# Patient Record
Sex: Female | Born: 1990 | Race: White | Hispanic: Yes | Marital: Single | Smoking: Current every day smoker
Health system: Southern US, Community
[De-identification: ages and names within clinical notes are randomized; demographics above are authoritative.]

## PROBLEM LIST (undated history)

## (undated) DIAGNOSIS — Z789 Other specified health status: Secondary | ICD-10-CM

## (undated) HISTORY — PX: NO PAST SURGERIES: SHX2092

---
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
## 2012-02-25 ENCOUNTER — Encounter (HOSPITAL_COMMUNITY): Payer: Self-pay | Admitting: Emergency Medicine

## 2012-02-25 ENCOUNTER — Emergency Department (INDEPENDENT_AMBULATORY_CARE_PROVIDER_SITE_OTHER)
Admission: EM | Admit: 2012-02-25 | Discharge: 2012-02-25 | Disposition: A | Discharge status: Home / Self Care | Payer: Medicaid Other | Source: Home / Self Care

## 2012-02-25 ENCOUNTER — Emergency Department (INDEPENDENT_AMBULATORY_CARE_PROVIDER_SITE_OTHER): Payer: Medicaid Other

## 2012-02-25 DIAGNOSIS — M549 Dorsalgia, unspecified: Secondary | ICD-10-CM

## 2012-02-25 MED ORDER — TRAMADOL HCL 50 MG PO TABS: 50.0000 mg | ORAL_TABLET | Freq: Four times a day (QID) | ORAL | Status: AC | PRN

## 2012-02-25 MED ORDER — KETOROLAC TROMETHAMINE 30 MG/ML IJ SOLN
INTRAMUSCULAR | Status: AC
  Filled 2012-02-25: qty 1

## 2012-02-25 MED ORDER — KETOROLAC TROMETHAMINE 60 MG/2ML IM SOLN
30.0000 mg | Freq: Once | INTRAMUSCULAR | Status: AC
  Administered 2012-02-25: 30 mg via INTRAMUSCULAR

## 2012-02-25 MED ORDER — METHYLPREDNISOLONE 4 MG PO KIT: PACK | ORAL | Status: AC
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
## 2012-02-27 NOTE — ED Provider Notes (Signed)
Medical screening examination/treatment/procedure(s) were performed by non-physician practitioner and as supervising physician I was immediately available for consultation/collaboration.  Leslee Home, M.D.   Reuben Likes, MD 02/27/12 [phone]

## 2012-02-29 ENCOUNTER — Other Ambulatory Visit (HOSPITAL_COMMUNITY): Payer: Self-pay | Admitting: Chiropractic Medicine

## 2012-02-29 DIAGNOSIS — K458 Other specified abdominal hernia without obstruction or gangrene: Secondary | ICD-10-CM

## 2012-03-01 ENCOUNTER — Ambulatory Visit (HOSPITAL_COMMUNITY)
Admission: RE | Admit: 2012-03-01 | Discharge: 2012-03-01 | Disposition: A | Discharge status: Home / Self Care | Payer: Medicaid Other | Source: Ambulatory Visit | Attending: Chiropractic Medicine | Admitting: Chiropractic Medicine

## 2012-03-01 DIAGNOSIS — M79609 Pain in unspecified limb: Secondary | ICD-10-CM | POA: Insufficient documentation

## 2012-03-01 DIAGNOSIS — K458 Other specified abdominal hernia without obstruction or gangrene: Secondary | ICD-10-CM

## 2012-03-01 DIAGNOSIS — M545 Low back pain, unspecified: Secondary | ICD-10-CM | POA: Insufficient documentation

## 2012-03-01 DIAGNOSIS — N281 Cyst of kidney, acquired: Secondary | ICD-10-CM | POA: Insufficient documentation

## 2014-10-12 ENCOUNTER — Emergency Department (INDEPENDENT_AMBULATORY_CARE_PROVIDER_SITE_OTHER): Payer: Medicaid Other

## 2014-10-12 ENCOUNTER — Encounter (HOSPITAL_COMMUNITY): Payer: Self-pay | Admitting: Emergency Medicine

## 2014-10-12 ENCOUNTER — Emergency Department (HOSPITAL_COMMUNITY)
Admission: EM | Admit: 2014-10-12 | Discharge: 2014-10-12 | Disposition: A | Discharge status: Home / Self Care | Payer: Medicaid Other | Source: Home / Self Care | Attending: Emergency Medicine | Admitting: Emergency Medicine

## 2014-10-12 DIAGNOSIS — S60042A Contusion of left ring finger without damage to nail, initial encounter: Secondary | ICD-10-CM
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
## 2014-10-12 NOTE — Discharge Instructions (Signed)
Your films are without without fracture or dislocation. Please wear splint as needed for comfort. Ice and elevation to reduce swelling. Ibuprofen as directed on packaging for pain.  If symptoms do not improve over next 1-2 weeks, please follow up with hand orthopedist listed on your discharge paperwork.  Contusion A contusion is a deep bruise. Contusions are the result of an injury that caused bleeding under the skin. The contusion may turn blue, purple, or yellow. Minor injuries will give you a painless contusion, but more severe contusions may stay painful and swollen for a few weeks.  CAUSES  A contusion is usually caused by a blow, trauma, or direct force to an area of the body. SYMPTOMS   Swelling and redness of the injured area.  Bruising of the injured area.  Tenderness and soreness of the injured area.  Pain. DIAGNOSIS  The diagnosis can be made by taking a history and physical exam. An X-ray, CT scan, or MRI may be needed to determine if there were any associated injuries, such as fractures. TREATMENT  Specific treatment will depend on what area of the body was injured. In general, the best treatment for a contusion is resting, icing, elevating, and applying cold compresses to the injured area. Over-the-counter medicines may also be recommended for pain control. Ask your caregiver what the best treatment is for your contusion. HOME CARE INSTRUCTIONS   Put ice on the injured area.  Put ice in a plastic bag.  Place a towel between your skin and the bag.  Leave the ice on for 15-20 minutes, 3-4 times a day, or as directed by your health care provider.  Only take over-the-counter or prescription medicines for pain, discomfort, or fever as directed by your caregiver. Your caregiver may recommend avoiding anti-inflammatory medicines (aspirin, ibuprofen, and naproxen) for 48 hours because these medicines may increase bruising.  Rest the injured area.  If possible, elevate the  injured area to reduce swelling. SEEK IMMEDIATE MEDICAL CARE IF:   You have increased bruising or swelling.  You have pain that is getting worse.  Your swelling or pain is not relieved with medicines. MAKE SURE YOU:   Understand these instructions.  Will watch your condition.  Will get help right away if you are not doing well or get worse. Document Released: 05/12/2005 Document Revised: 08/07/2013 Document Reviewed: 06/07/2011 [facility] Patient Information 2015 [employer], [place]. This information is not intended to replace advice given to you by your health care provider. Make sure you discuss any questions you have with your health care provider.
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
## 2016-05-20 ENCOUNTER — Encounter (HOSPITAL_COMMUNITY): Payer: Self-pay | Admitting: Emergency Medicine

## 2017-05-05 ENCOUNTER — Emergency Department (HOSPITAL_COMMUNITY): Payer: Medicaid Other

## 2017-05-05 ENCOUNTER — Emergency Department (HOSPITAL_COMMUNITY)
Admission: EM | Admit: 2017-05-05 | Discharge: 2017-05-06 | Disposition: A | Discharge status: Home / Self Care | Payer: Medicaid Other | Attending: Emergency Medicine | Admitting: Emergency Medicine

## 2017-05-05 ENCOUNTER — Encounter (HOSPITAL_COMMUNITY): Payer: Self-pay

## 2017-05-05 DIAGNOSIS — R1310 Dysphagia, unspecified: Secondary | ICD-10-CM | POA: Diagnosis present

## 2017-05-05 DIAGNOSIS — Z79899 Other long term (current) drug therapy: Secondary | ICD-10-CM | POA: Insufficient documentation

## 2017-05-05 DIAGNOSIS — R0989 Other specified symptoms and signs involving the circulatory and respiratory systems: Secondary | ICD-10-CM

## 2017-05-05 DIAGNOSIS — F17200 Nicotine dependence, unspecified, uncomplicated: Secondary | ICD-10-CM | POA: Diagnosis not present

## 2017-05-05 NOTE — ED Provider Notes (Signed)
[facility]-EMERGENCY DEPT Provider Note   CSN: [id number] Arrival date & time: 05/05/17  1954     History   Chief Complaint Chief Complaint  Patient presents with  . Swallowed Foreign Body    HPI Mariah Lozano is a 26 y.o. female.  26 year old female presents to the emergency department for foreign body sensation in her throat. Symptoms have been present for 48 hours. She denies onset of symptoms when eating. She has tried drinking soda to relieve her symptoms. She believes they feel worse at nighttime. She denies vomiting, inability to swallow, or drooling. No SOB. She has a hx of bipolar, but feels this is well controlled; denies anxiety.       History reviewed. No pertinent past medical history.  There are no active problems to display for this patient.   History reviewed. No pertinent surgical history.  OB History    No data available       Home Medications    Prior to Admission medications   Medication Sig Start Date End Date Taking? Authorizing Provider  QUEtiapine (SEROQUEL) 200 MG tablet Take 200 mg by mouth at bedtime.    [provider]    Family History History reviewed. No pertinent family history.  Social History Social History  Substance Use Topics  . Smoking status: Current Every Day Smoker  . Smokeless tobacco: Never Used  . Alcohol use No     Allergies   Patient has no known allergies.   Review of Systems Review of Systems Ten systems reviewed and are negative for acute change, except as noted in the HPI.    Physical Exam Updated Vital Signs BP 107/68 (BP Location: Left Arm)   Pulse 82   Temp 97.9 F (36.6 C) (Oral)   Resp 18   Wt 58.7 kg (129 lb 8 oz)   LMP 05/05/2017   SpO2 100%   Physical Exam  Constitutional: She is oriented to person, place, and time. She appears well-developed and well-nourished. No distress.  Nontoxic appearing and in NAD  HENT:  Head: Normocephalic and atraumatic.  Mouth/Throat:  Oropharynx is clear and moist.  Patient tolerating secretions without difficulty.  Eyes: Conjunctivae and EOM are normal. No scleral icterus.  Neck: Normal range of motion.  No stridor  Cardiovascular: Normal rate, regular rhythm and intact distal pulses.   Pulmonary/Chest: Effort normal. No respiratory distress.  Respirations even and unlabored. Lungs CTAB.  Musculoskeletal: Normal range of motion.  Neurological: She is alert and oriented to person, place, and time. She exhibits normal muscle tone. Coordination normal.  Skin: Skin is warm and dry. No rash noted. She is not diaphoretic. No erythema. No pallor.  Psychiatric: She has a normal mood and affect. Her behavior is normal.  Nursing note and vitals reviewed.    ED Treatments / Results  Labs (all labs ordered are listed, but only abnormal results are displayed) Labs Reviewed - No data to display  EKG  EKG Interpretation None       Radiology Ct Soft Tissue Neck Wo Contrast  Result Date: 05/06/2017 CLINICAL DATA:  Dysphagia with foreign body sensation EXAM: CT NECK WITHOUT CONTRAST TECHNIQUE: Multidetector CT imaging of the neck was performed following the standard protocol without intravenous contrast. COMPARISON:  None. FINDINGS: Pharynx and larynx: --Nasopharynx: Fossae of Rosenmuller are clear. Normal adenoid tonsils for age. --Oral cavity and oropharynx: The palatine and lingual tonsils are normal. The visible oral cavity and floor of mouth are normal. Tongue jewelry. --Hypopharynx: Normal vallecula  and pyriform sinuses. --Larynx: Normal epiglottis and pre-epiglottic space. Normal aryepiglottic and vocal folds. --Retropharyngeal space: No abscess, effusion or lymphadenopathy. Salivary glands: --Parotid: No mass lesion or inflammation. No sialolithiasis or ductal dilatation. --Submandibular: Symmetric without inflammation. No sialolithiasis or ductal dilatation. --Sublingual: Normal. No ranula or other visible lesion of the  base of tongue and floor of mouth. Thyroid: Normal. Lymph nodes: No enlarged or abnormal density lymph nodes. Vascular: Normal course and caliber. Limited intracranial: Normal. Visualized orbits: Normal. Mastoids and visualized paranasal sinuses: No fluid levels or advanced mucosal thickening. No mastoid effusion. Skeleton: No bony spinal canal stenosis. No lytic or blastic lesions. Upper chest: Clear. Other: No foreign body identified. IMPRESSION: No aerodigestive tract foreign body or other abnormality of the neck. Electronically Signed   By: Deatra Robinson M.D.   On: 05/06/2017 01:56    Procedures Procedures (including critical care time)  Medications Ordered in ED Medications - No data to display   Initial Impression / Assessment and Plan / ED Course  I have reviewed the triage vital signs and the nursing notes.  Pertinent labs & imaging results that were available during my care of the patient were reviewed by me and considered in my medical decision making (see chart for details).     26 year old female presents to the emergency department for sensation of foreign body in her throat. Symptoms persistent 48 hours. Patient tolerating secretions. No shortness of breath or hypoxia. No stridor. Imaging negative for foreign body or other abnormality. 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 Plan to refer to ENT should symptoms persist. Return precautions discussed and provided. Patient discharged in stable condition with no unaddressed concerns.   Final Clinical Impressions(s) / ED Diagnoses   Final diagnoses:  Foreign body sensation in throat    New Prescriptions New Prescriptions   No medications on file     Antony Madura, Cordelia Poche 05/06/17 0225    Mesner, Barbara Cower, MD 05/09/17 [phone]

## 2022-01-16 ENCOUNTER — Emergency Department (HOSPITAL_COMMUNITY): Payer: Medicaid Other

## 2022-01-16 ENCOUNTER — Encounter (HOSPITAL_COMMUNITY): Payer: Self-pay

## 2022-01-16 ENCOUNTER — Other Ambulatory Visit: Payer: Self-pay

## 2022-01-16 ENCOUNTER — Inpatient Hospital Stay (HOSPITAL_COMMUNITY)
Admission: EM | Admit: 2022-01-16 | Discharge: 2022-01-18 | DRG: 378 | Disposition: A | Discharge status: Home / Self Care | Payer: Medicaid Other | Attending: Internal Medicine | Admitting: Internal Medicine

## 2022-01-16 DIAGNOSIS — E8809 Other disorders of plasma-protein metabolism, not elsewhere classified: Secondary | ICD-10-CM | POA: Diagnosis present

## 2022-01-16 DIAGNOSIS — D72829 Elevated white blood cell count, unspecified: Secondary | ICD-10-CM | POA: Diagnosis present

## 2022-01-16 DIAGNOSIS — F17210 Nicotine dependence, cigarettes, uncomplicated: Secondary | ICD-10-CM | POA: Diagnosis present

## 2022-01-16 DIAGNOSIS — K449 Diaphragmatic hernia without obstruction or gangrene: Secondary | ICD-10-CM | POA: Diagnosis present

## 2022-01-16 DIAGNOSIS — K264 Chronic or unspecified duodenal ulcer with hemorrhage: Principal | ICD-10-CM | POA: Diagnosis present

## 2022-01-16 DIAGNOSIS — R112 Nausea with vomiting, unspecified: Secondary | ICD-10-CM

## 2022-01-16 DIAGNOSIS — K551 Chronic vascular disorders of intestine: Secondary | ICD-10-CM

## 2022-01-16 DIAGNOSIS — Z681 Body mass index (BMI) 19 or less, adult: Secondary | ICD-10-CM | POA: Diagnosis not present

## 2022-01-16 DIAGNOSIS — R1013 Epigastric pain: Principal | ICD-10-CM

## 2022-01-16 DIAGNOSIS — Z79899 Other long term (current) drug therapy: Secondary | ICD-10-CM | POA: Diagnosis not present

## 2022-01-16 DIAGNOSIS — K269 Duodenal ulcer, unspecified as acute or chronic, without hemorrhage or perforation: Secondary | ICD-10-CM | POA: Diagnosis not present

## 2022-01-16 DIAGNOSIS — R636 Underweight: Secondary | ICD-10-CM | POA: Diagnosis present

## 2022-01-16 DIAGNOSIS — R109 Unspecified abdominal pain: Secondary | ICD-10-CM | POA: Diagnosis not present

## 2022-01-16 DIAGNOSIS — F411 Generalized anxiety disorder: Secondary | ICD-10-CM | POA: Diagnosis present

## 2022-01-16 HISTORY — DX: Other specified health status: Z78.9

## 2022-01-16 LAB — URINALYSIS, ROUTINE W REFLEX MICROSCOPIC
Bilirubin Urine: NEGATIVE
Glucose, UA: NEGATIVE mg/dL
Hgb urine dipstick: NEGATIVE
Ketones, ur: NEGATIVE mg/dL
Leukocytes,Ua: NEGATIVE
Nitrite: NEGATIVE
Protein, ur: 30 mg/dL — AB
Specific Gravity, Urine: 1.023 (ref 1.005–1.030)
pH: 9 — ABNORMAL HIGH (ref 5.0–8.0)

## 2022-01-16 LAB — CBC
HCT: 41.7 % (ref 36.0–46.0)
Hemoglobin: 14 g/dL (ref 12.0–15.0)
MCH: 32 pg (ref 26.0–34.0)
MCHC: 33.6 g/dL (ref 30.0–36.0)
MCV: 95.2 fL (ref 80.0–100.0)
Platelets: 364 10*3/uL (ref 150–400)
RBC: 4.38 MIL/uL (ref 3.87–5.11)
RDW: 13.7 % (ref 11.5–15.5)
WBC: 11.2 10*3/uL — ABNORMAL HIGH (ref 4.0–10.5)
nRBC: 0 % (ref 0.0–0.2)

## 2022-01-16 LAB — COMPREHENSIVE METABOLIC PANEL
ALT: 19 U/L (ref 0–44)
AST: 16 U/L (ref 15–41)
Albumin: 4.4 g/dL (ref 3.5–5.0)
Alkaline Phosphatase: 43 U/L (ref 38–126)
Anion gap: 5 (ref 5–15)
BUN: 11 mg/dL (ref 6–20)
CO2: 27 mmol/L (ref 22–32)
Calcium: 8.9 mg/dL (ref 8.9–10.3)
Chloride: 109 mmol/L (ref 98–111)
Creatinine, Ser: 0.66 mg/dL (ref 0.44–1.00)
GFR, Estimated: >60 mL/min (ref >60)
Glucose, Bld: 101 mg/dL — ABNORMAL HIGH (ref 70–99)
Potassium: 3.8 mmol/L (ref 3.5–5.1)
Sodium: 141 mmol/L (ref 135–145)
Total Bilirubin: 0.5 mg/dL (ref 0.3–1.2)
Total Protein: 7 g/dL (ref 6.5–8.1)

## 2022-01-16 LAB — I-STAT BETA HCG BLOOD, ED (MC, WL, AP ONLY): I-stat hCG, quantitative: <5 m[IU]/mL (ref <5)

## 2022-01-16 LAB — LIPASE, BLOOD: Lipase: 24 U/L (ref 11–51)

## 2022-01-16 MED ORDER — SODIUM CHLORIDE 0.9 % IV SOLN: INTRAVENOUS | Status: DC

## 2022-01-16 MED ORDER — ONDANSETRON 4 MG PO TBDP
4.0000 mg | ORAL_TABLET | Freq: Once | ORAL | Status: AC
  Administered 2022-01-16: 4 mg via ORAL
  Filled 2022-01-16: qty 1

## 2022-01-16 MED ORDER — LORAZEPAM 2 MG/ML IJ SOLN
1.0000 mg | Freq: Once | INTRAMUSCULAR | Status: AC
  Administered 2022-01-16: 1 mg via INTRAVENOUS
  Filled 2022-01-16: qty 1

## 2022-01-16 MED ORDER — ACETAMINOPHEN 325 MG PO TABS: 650.0000 mg | ORAL_TABLET | Freq: Four times a day (QID) | ORAL | Status: DC | PRN

## 2022-01-16 MED ORDER — NALOXONE HCL 0.4 MG/ML IJ SOLN: 0.4000 mg | INTRAMUSCULAR | Status: DC | PRN

## 2022-01-16 MED ORDER — ONDANSETRON HCL 4 MG/2ML IJ SOLN
4.0000 mg | Freq: Once | INTRAMUSCULAR | Status: AC
  Administered 2022-01-16: 4 mg via INTRAVENOUS
  Filled 2022-01-16: qty 2

## 2022-01-16 MED ORDER — PANTOPRAZOLE SODIUM 40 MG IV SOLR
40.0000 mg | Freq: Once | INTRAVENOUS | Status: AC
  Administered 2022-01-16: 40 mg via INTRAVENOUS
  Filled 2022-01-16: qty 10

## 2022-01-16 MED ORDER — ALUM & MAG HYDROXIDE-SIMETH 200-200-20 MG/5ML PO SUSP
30.0000 mL | Freq: Once | ORAL | Status: AC
  Administered 2022-01-16: 30 mL via ORAL
  Filled 2022-01-16: qty 30

## 2022-01-16 MED ORDER — IOHEXOL 300 MG/ML  SOLN
100.0000 mL | Freq: Once | INTRAMUSCULAR | Status: AC | PRN
  Administered 2022-01-16: 80 mL via INTRAVENOUS

## 2022-01-16 MED ORDER — HYDROMORPHONE HCL 1 MG/ML IJ SOLN: 0.5000 mg | INTRAMUSCULAR | Status: DC | PRN

## 2022-01-16 MED ORDER — ONDANSETRON HCL 4 MG/2ML IJ SOLN: 4.0000 mg | Freq: Four times a day (QID) | INTRAMUSCULAR | Status: DC | PRN

## 2022-01-16 MED ORDER — POTASSIUM CHLORIDE IN NACL 20-0.9 MEQ/L-% IV SOLN
INTRAVENOUS | Status: DC
  Filled 2022-01-16 (×3): qty 1000

## 2022-01-16 MED ORDER — SODIUM CHLORIDE 0.9 % IV BOLUS
1000.0000 mL | Freq: Once | INTRAVENOUS | Status: AC
  Administered 2022-01-16: 1000 mL via INTRAVENOUS

## 2022-01-16 MED ORDER — LORAZEPAM 2 MG/ML IJ SOLN: 1.0000 mg | Freq: Four times a day (QID) | INTRAMUSCULAR | Status: DC | PRN

## 2022-01-16 MED ORDER — ACETAMINOPHEN 650 MG RE SUPP: 650.0000 mg | Freq: Four times a day (QID) | RECTAL | Status: DC | PRN

## 2022-01-16 MED ORDER — LIDOCAINE VISCOUS HCL 2 % MT SOLN
15.0000 mL | Freq: Once | OROMUCOSAL | Status: AC
  Administered 2022-01-16: 15 mL via ORAL
  Filled 2022-01-16: qty 15

## 2022-01-16 NOTE — Progress Notes (Signed)
  Carryover admission to the Day Admitter.  I discussed this case with the EDP, Dr.Horton.  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  Quadrant ultrasound reportedly showed no evidence of acute pathology.  However, ensuing CT abdomen/pelvis reportedly showed findings suggestive of superior mesenteric artery syndrome.  Dr. Dina Rich has discussed patient's case and imaging with the on-call [facility] gastroenterologist, Dr.Karki, Who will consult and see the patient in the morning.  Additionally, PCP is please consult with the on-call general surgeon.  Orders for NG tube have also been placed.  I have placed an order for inpatient admission for the above.   I have placed some additional preliminary admit orders via the adult multi-morbid admission order set. I have also ordered n.p.o., prn IV Dilaudid, prn IV Zofran.  Confirmed order for continuous IV fluids.    Babs Bertin, DO Hospitalist

## 2022-01-16 NOTE — H&P (Signed)
History and Physical    Patient: Mariah Lozano MBW:[id number] DOB: Mar 12, 1991 DOA: 01/16/2022 DOS: the patient was seen and examined on 01/16/2022 PCP: [facility]  Patient coming from: Home  Chief Complaint:  Chief Complaint  Patient presents with   Abdominal Pain   HPI: Mariah Lozano is a 31 y.o. female with no previous past medical history who is coming to the emergency department with complaints of abdominal pain for the past 5 days that worsens with food intake associated with daily nausea and emesis.  No fever, chills, diarrhea, constipation, melena or hematochezia.  No flank pain, dysuria, frequency or hematuria.  ED course: Initial vital signs were temperature 97.6 F, pulse 80, respiration 18, BP 118/77 mmHg O2 sat 100% on room air.  The patient received Maalox with Ciloxan, ondansetron 4 mg ODT, ondansetron 4 mg IVP, pantoprazole 40 mg IVP and 1000 mL of normal saline bolus.  Lab work: Urinalysis was cloudy with an increased pH of 9.0, protein of 30 mg/dL and rare bacteria microscopic examination.  CBC with a white count 11.2, hemoglobin 14.0 g/dL platelets 017.  Lipase was normal.  I-STAT hCG was negative.  CMP showed a glucose of 101 mg deciliter, but was otherwise normal.  Imaging: RUQ ultrasound was negative.  CT abdomen with contrast showed extremely thin body habitus, with paucity of intramesenteric and retroperitoneal fat this is associated with marked narrowing of the third portion of the duodenum as it transitions beneath the superior mesenteric artery.  No other potential acute findings are seen in the abdomen or pelvis to account for the patient's symptoms.   Review of Systems: As mentioned in the history of present illness. All other systems reviewed and are negative. History reviewed. No pertinent past medical history. History reviewed. No pertinent surgical history. Social History:  reports that she has been smoking. She has  never used smokeless tobacco. She reports current drug use. Drug: Marijuana. She reports that she does not drink alcohol.  No Known Allergies  History reviewed.  Per patient, to her knowledge at this moment, there is no medical family history.  Prior to Admission medications   Medication Sig Start Date End Date Taking? Authorizing Provider  QUEtiapine (SEROQUEL) 200 MG tablet Take 200 mg by mouth at bedtime.   Yes [provider]    Physical Exam: Vitals:   01/16/22 0355 01/16/22 0430 01/16/22 0500 01/16/22 1000  BP: 101/67 106/67 112/71 91/64  Pulse: 68 74 65 81  Resp: 16 16 16 19   Temp:      TempSrc:      SpO2: 100% 100% 100% 98%  Weight:      Height:       Physical Exam Vitals and nursing note reviewed.  Constitutional:      General: She is awake. She is not in acute distress.    Appearance: She is underweight.  HENT:     Head: Normocephalic.     Mouth/Throat:     Mouth: Mucous membranes are dry.  Eyes:     General: No scleral icterus.    Pupils: Pupils are equal, round, and reactive to light.  Neck:     Vascular: No JVD.  Cardiovascular:     Rate and Rhythm: Normal rate and regular rhythm.     Heart sounds: S1 normal and S2 normal.  Pulmonary:     Effort: Pulmonary effort is normal.     Breath sounds: Normal breath sounds. No wheezing, rhonchi or rales.  Abdominal:     General: Abdomen is flat. Bowel sounds are normal.     Palpations: Abdomen is soft.     Tenderness: There is no abdominal tenderness. There is no right CVA tenderness, left CVA tenderness, guarding or rebound.  Musculoskeletal:     Cervical back: Neck supple.     Right lower leg: No edema.     Left lower leg: No edema.  Skin:    General: Skin is warm and dry.     Coloration: Skin is not jaundiced.  Neurological:     General: No focal deficit present.     Mental Status: She is alert and oriented to person, place, and time.  Psychiatric:        Mood and Affect: Mood normal.         Behavior: Behavior normal. Behavior is cooperative.    Data Reviewed:  There are no new results to review at this time.  Assessment and Plan: Principal Problem:   Superior mesenteric artery syndrome (HCC) Observation/MedSurg. Keep NPO. Continue IV fluids. NGT suction. Analgesics as needed. Antiemetics as needed. Pantoprazole 40 mg IVP every 24 hours. Keep electrolytes optimized. Follow-up CBC and CMP in AM. Follow-up imaging in the morning. General surgery input appreciated. Gastroenterology will evaluate.    Advance Care Planning:   Code Status: Full Code   Consults: Gastroenterology (Dr. Marca Ancona) and gastroenterology (Dr. Freida Busman).  Family Communication:   Severity of Illness: The appropriate patient status for this patient is INPATIENT. Inpatient status is judged to be reasonable and necessary in order to provide the required intensity of service to ensure the patient's safety. The patient's presenting symptoms, physical exam findings, and initial radiographic and laboratory data in the context of their chronic comorbidities is felt to place them at high risk for further clinical deterioration. Furthermore, it is not anticipated that the patient will be medically stable for discharge from the hospital within 2 midnights of admission.   * I certify that at the point of admission it is my clinical judgment that the patient will require inpatient hospital care spanning beyond 2 midnights from the point of admission due to high intensity of service, high risk for further deterioration and high frequency of surveillance required.*  Author: Bobette Mo, MD 01/16/2022 10:35 AM  For on call review [email or web address].   This document was prepared using [employer] and may contain some unintended transcription errors.

## 2022-01-16 NOTE — Consult Note (Signed)
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 female was in her usual state of health until 6 days ago when she developed nausea and vomiting associated with upper abdominal pain. Patient states she has been under a lot of stress, recently ended a 12-year-long relationship and has lost over 15 pounds in 4 weeks. She has decreased appetite, early satiety, and for the last several days has several episodes of nausea and vomiting, with 1 episode of small blood tinged emesis last night. She has not noted any blood in stool or black stools. Denies difficulty swallowing or pain on swallowing. Denies acid reflux or heartburn. Denies prior EGD or colonoscopy.  She smokes cigarettes, smokes marijuana and takes recreational drugs, including ecstasy, denies IV drug abuse. Denies alcohol use.   History reviewed. No pertinent past medical history.  History reviewed. No pertinent surgical history.  Prior to Admission medications   Medication Sig Start Date End Date Taking? Authorizing Provider  QUEtiapine (SEROQUEL) 200 MG tablet Take 200 mg by mouth at bedtime.   Yes [provider]    Current Facility-Administered Medications  Medication Dose Route Frequency Provider Last Rate Last Admin   0.9 % NaCl with KCl 20 mEq/ L  infusion   Intravenous Continuous Bobette Mortiz, David Manuel, MD       acetaminophen (TYLENOL) tablet 650 mg  650 mg Oral Q6H PRN Howerter, Justin B, DO       Or   acetaminophen (TYLENOL) suppository 650 mg  650 mg Rectal Q6H PRN Howerter, Justin B, DO       HYDROmorphone (DILAUDID) injection 0.5 mg  0.5 mg Intravenous Q2H PRN Howerter, Justin B, DO       naloxone (NARCAN) injection 0.4 mg  0.4 mg Intravenous  PRN Howerter, Justin B, DO       ondansetron (ZOFRAN) injection 4 mg  4 mg Intravenous Q6H PRN Howerter, Justin B, DO       Current Outpatient Medications  Medication Sig Dispense Refill   QUEtiapine (SEROQUEL) 200 MG tablet Take 200 mg by mouth at bedtime.      Allergies as of 01/16/2022   (No Known Allergies)    History reviewed. No pertinent family history.  Social History   Socioeconomic History   Marital status: Single    Spouse name: Not on file   Number of children: Not on file   Years of education: Not on file   Highest education level: Not on file  Occupational History   Not on file  Tobacco Use   Smoking status: Every Day   Smokeless tobacco: Never  Substance and Sexual Activity   Alcohol use: No   Drug use: Yes    Types: Marijuana   Sexual activity: Not on file  Other Topics Concern   Not on file  Social History Narrative   ** Merged History Encounter **       Social Determinants of Health   Financial Resource Strain: Not on file  Food Insecurity: Not on file  Transportation Needs: Not on file  Physical Activity: Not on file  Stress: Not on file  Social Connections: Not on file  Intimate Partner Violence: Not on file    Review of Systems: Positive for GI: Described in detail in HPI.    Gen: involuntary weight loss, denies  any fever, chills, rigors, night sweats, anorexia, fatigue, weakness, malaise  and sleep disorder CV: Denies chest pain, angina, palpitations, syncope, orthopnea, PND, peripheral edema, and claudication. Resp: Denies dyspnea, cough, sputum, wheezing, coughing up blood. GU : Denies urinary burning, blood in urine, urinary frequency, urinary hesitancy, nocturnal urination, and urinary incontinence. MS: Denies joint pain or swelling.  Denies muscle weakness, cramps, atrophy.  Derm: Denies rash, itching, oral ulcerations, hives, unhealing ulcers.  Psych: Depression and anxiety, on Seroquel Heme: Denies bruising, bleeding, and enlarged  lymph nodes. Neuro:  Denies any headaches, dizziness, paresthesias. Endo:  Denies any problems with DM, thyroid, adrenal function.  Physical Exam: Vital signs in last 24 hours: Temp:  [97.6 F (36.4 C)] 97.6 F (36.4 C) (06/03 0040) Pulse Rate:  [65-81] 81 (06/03 1000) Resp:  [16-19] 19 (06/03 1000) BP: (91-118)/(64-77) 91/64 (06/03 1000) SpO2:  [98 %-100 %] 98 % (06/03 1000) Weight:  [49 kg] 49 kg (06/03 0040)    General:   Alert,  Well-developed, well-nourished, pleasant and cooperative in NAD Head:  Normocephalic and atraumatic. Eyes:  Sclera clear, no icterus.   Conjunctiva pink. Ears:  Normal auditory acuity. Nose:  No deformity, discharge,  or lesions. Mouth:  No deformity or lesions.  Oropharynx pink & moist. Neck:  Supple; no masses or thyromegaly. Lungs:  Clear throughout to auscultation.   No wheezes, crackles, or rhonchi. No acute distress. Heart:  Regular rate and rhythm; no murmurs, clicks, rubs,  or gallops. Extremities:  Without clubbing or edema. Neurologic:  Alert and  oriented x4;  grossly normal neurologically. Skin:  Intact without significant lesions or rashes. Psych: Anxious, tearful Abdomen:  Soft, mild epigastric tenderness and nondistended. No masses, hepatosplenomegaly or hernias noted. Normal bowel sounds, without guarding, and without rebound.         Lab Results: Recent Labs    01/16/22 0042  WBC 11.2*  HGB 14.0  HCT 41.7  PLT 364   BMET Recent Labs    01/16/22 0042  NA 141  K 3.8  CL 109  CO2 27  GLUCOSE 101*  BUN 11  CREATININE 0.66  CALCIUM 8.9   LFT Recent Labs    01/16/22 0042  PROT 7.0  ALBUMIN 4.4  AST 16  ALT 19  ALKPHOS 43  BILITOT 0.5   PT/INR No results for input(s): LABPROT, INR in the last 72 hours.  Studies/Results: CT ABDOMEN PELVIS W CONTRAST  Result Date: 01/16/2022 CLINICAL DATA:  31 year old female with history of nausea and vomiting. EXAM: CT ABDOMEN AND PELVIS WITH CONTRAST TECHNIQUE:  Multidetector CT imaging of the abdomen and pelvis was performed using the standard protocol following bolus administration of intravenous contrast. RADIATION DOSE REDUCTION: This exam was performed according to the departmental dose-optimization program which includes automated exposure control, adjustment of the mA and/or kV according to patient size and/or use of iterative reconstruction technique. CONTRAST:  40mL OMNIPAQUE IOHEXOL 300 MG/ML  SOLN COMPARISON:  No priors. FINDINGS: Lower chest: Unremarkable. Hepatobiliary: No suspicious cystic or solid hepatic lesions. No intra or extrahepatic biliary ductal dilatation. Gallbladder is normal in appearance. Pancreas: No pancreatic mass. No pancreatic ductal dilatation. No pancreatic or peripancreatic fluid collections or inflammatory changes. Spleen: Unremarkable. Adrenals/Urinary Tract: Bilateral kidneys and adrenal glands are normal in appearance. No hydroureteronephrosis. Urinary bladder is unremarkable in appearance. Stomach/Bowel: Stomach is moderately distended. Duodenum also appears distended measuring up to 3.7 cm in diameter in the distal second and proximal third portion of the duodenum for transition beneath the  superior mesenteric artery, beyond which the small bowel appears largely decompressed. No other pathologic dilatation of small bowel or colon. The appendix is not confidently identified and may be surgically absent. Regardless, there are no inflammatory changes noted adjacent to the cecum to suggest the presence of an acute appendicitis at this time. Vascular/Lymphatic: No significant atherosclerotic disease, aneurysm or dissection noted in the abdominal or pelvic vasculature. Narrowed aortomesenteric angle of 9.7 degrees (normal 28-65 degrees) and decreased aortomesenteric distance of only 5 mm (normal 10-34 mm). No lymphadenopathy noted in the abdomen or pelvis. Reproductive: Uterus and ovaries are unremarkable in appearance. Other: Markedly  thin body habitus with paucity of intra mesenteric and retroperitoneal fat. No significant volume of ascites. No pneumoperitoneum. Musculoskeletal: There are no aggressive appearing lytic or blastic lesions noted in the visualized portions of the skeleton. IMPRESSION: 1. Extremely thin body habitus, with paucity of intra mesenteric and retroperitoneal fat. This is associated with marked narrowing of the third portion of the duodenum as it transitions beneath the superior mesenteric artery, with narrowed aortomesenteric angle and distance, as detailed above. Clinical correlation for signs and symptoms of superior mesenteric artery syndrome are recommended. 2. No other potential acute findings are noted elsewhere in the abdomen or pelvis to account for the patient's symptoms. Electronically Signed   By: Trudie Reed M.D.   On: 01/16/2022 05:32   US Abdomen Limited RUQ (LIVER/GB)  Result Date: 01/16/2022 CLINICAL DATA:  Epigastric pain for 5 days EXAM: ULTRASOUND ABDOMEN LIMITED RIGHT UPPER QUADRANT COMPARISON:  None Available. FINDINGS: Gallbladder: No gallstones or wall thickening visualized. No sonographic Murphy sign noted by sonographer. Common bile duct: Diameter: 4.7 mm Liver: No focal lesion identified. Within normal limits in parenchymal echogenicity. Portal vein is patent on color Doppler imaging with normal direction of blood flow towards the liver. Other: None. IMPRESSION: Negative examination Electronically Signed   By: Jasmine Pang M.D.   On: 01/16/2022 03:43    Impression: Nausea, vomiting, 15 pound weight loss in 4 weeks  CT abdomen: Marked narrowing of third portion of duodenum as it transitions beneath the superior mesenteric artery with narrowed aortomesenteric angle and distance, associated with moderately distended stomach, distended duodenum up to 3.7 cm in distal second and proximal third portion Extremely thin body habitus with paucity of intra mesenteric and retroperitoneal  fat  Normal electrolytes, normal renal function, normal T. 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 Recommend NG tube placement, keep to low intermittent wall suction. Supportive management-IV fluids for now. Plan EGD in a.m. Surgery has been consulted.   LOS: 0 days   Kerin Salen, MD  01/16/2022, 11:01 AM

## 2022-01-16 NOTE — ED Notes (Signed)
Attempted to insert NG tube, pt stated she couldn't do it and req RN to stop.

## 2022-01-16 NOTE — H&P (View-Only) (Signed)
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 female was in her usual state of health until 6 days ago when she developed nausea and vomiting associated with upper abdominal pain. Patient states she has been under a lot of stress, recently ended a 12-year-long relationship and has lost over 15 pounds in 4 weeks. She has decreased appetite, early satiety, and for the last several days has several episodes of nausea and vomiting, with 1 episode of small blood tinged emesis last night. She has not noted any blood in stool or black stools. Denies difficulty swallowing or pain on swallowing. Denies acid reflux or heartburn. Denies prior EGD or colonoscopy.  She smokes cigarettes, smokes marijuana and takes recreational drugs, including ecstasy, denies IV drug abuse. Denies alcohol use.   History reviewed. No pertinent past medical history.  History reviewed. No pertinent surgical history.  Prior to Admission medications   Medication Sig Start Date End Date Taking? Authorizing Provider  QUEtiapine (SEROQUEL) 200 MG tablet Take 200 mg by mouth at bedtime.   Yes [provider]    Current Facility-Administered Medications  Medication Dose Route Frequency Provider Last Rate Last Admin   0.9 % NaCl with KCl 20 mEq/ L  infusion   Intravenous Continuous Ortiz, David Manuel, MD       acetaminophen (TYLENOL) tablet 650 mg  650 mg Oral Q6H PRN Howerter, Justin B, DO       Or   acetaminophen (TYLENOL) suppository 650 mg  650 mg Rectal Q6H PRN Howerter, Justin B, DO       HYDROmorphone (DILAUDID) injection 0.5 mg  0.5 mg Intravenous Q2H PRN Howerter, Justin B, DO       naloxone (NARCAN) injection 0.4 mg  0.4 mg Intravenous  PRN Howerter, Justin B, DO       ondansetron (ZOFRAN) injection 4 mg  4 mg Intravenous Q6H PRN Howerter, Justin B, DO       Current Outpatient Medications  Medication Sig Dispense Refill   QUEtiapine (SEROQUEL) 200 MG tablet Take 200 mg by mouth at bedtime.      Allergies as of 01/16/2022   (No Known Allergies)    History reviewed. No pertinent family history.  Social History   Socioeconomic History   Marital status: Single    Spouse name: Not on file   Number of children: Not on file   Years of education: Not on file   Highest education level: Not on file  Occupational History   Not on file  Tobacco Use   Smoking status: Every Day   Smokeless tobacco: Never  Substance and Sexual Activity   Alcohol use: No   Drug use: Yes    Types: Marijuana   Sexual activity: Not on file  Other Topics Concern   Not on file  Social History Narrative   ** Merged History Encounter **       Social Determinants of Health   Financial Resource Strain: Not on file  Food Insecurity: Not on file  Transportation Needs: Not on file  Physical Activity: Not on file  Stress: Not on file  Social Connections: Not on file  Intimate Partner Violence: Not on file    Review of Systems: Positive for GI: Described in detail in HPI.    Gen: involuntary weight loss, denies   any fever, chills, rigors, night sweats, anorexia, fatigue, weakness, malaise  and sleep disorder CV: Denies chest pain, angina, palpitations, syncope, orthopnea, PND, peripheral edema, and claudication. Resp: Denies dyspnea, cough, sputum, wheezing, coughing up blood. GU : Denies urinary burning, blood in urine, urinary frequency, urinary hesitancy, nocturnal urination, and urinary incontinence. MS: Denies joint pain or swelling.  Denies muscle weakness, cramps, atrophy.  Derm: Denies rash, itching, oral ulcerations, hives, unhealing ulcers.  Psych: Depression and anxiety, on Seroquel Heme: Denies bruising, bleeding, and enlarged  lymph nodes. Neuro:  Denies any headaches, dizziness, paresthesias. Endo:  Denies any problems with DM, thyroid, adrenal function.  Physical Exam: Vital signs in last 24 hours: Temp:  [97.6 F (36.4 C)] 97.6 F (36.4 C) (06/03 0040) Pulse Rate:  [65-81] 81 (06/03 1000) Resp:  [16-19] 19 (06/03 1000) BP: (91-118)/(64-77) 91/64 (06/03 1000) SpO2:  [98 %-100 %] 98 % (06/03 1000) Weight:  [49 kg] 49 kg (06/03 0040)    General:   Alert,  Well-developed, well-nourished, pleasant and cooperative in NAD Head:  Normocephalic and atraumatic. Eyes:  Sclera clear, no icterus.   Conjunctiva pink. Ears:  Normal auditory acuity. Nose:  No deformity, discharge,  or lesions. Mouth:  No deformity or lesions.  Oropharynx pink & moist. Neck:  Supple; no masses or thyromegaly. Lungs:  Clear throughout to auscultation.   No wheezes, crackles, or rhonchi. No acute distress. Heart:  Regular rate and rhythm; no murmurs, clicks, rubs,  or gallops. Extremities:  Without clubbing or edema. Neurologic:  Alert and  oriented x4;  grossly normal neurologically. Skin:  Intact without significant lesions or rashes. Psych: Anxious, tearful Abdomen:  Soft, mild epigastric tenderness and nondistended. No masses, hepatosplenomegaly or hernias noted. Normal bowel sounds, without guarding, and without rebound.         Lab Results: Recent Labs    01/16/22 0042  WBC 11.2*  HGB 14.0  HCT 41.7  PLT 364   BMET Recent Labs    01/16/22 0042  NA 141  K 3.8  CL 109  CO2 27  GLUCOSE 101*  BUN 11  CREATININE 0.66  CALCIUM 8.9   LFT Recent Labs    01/16/22 0042  PROT 7.0  ALBUMIN 4.4  AST 16  ALT 19  ALKPHOS 43  BILITOT 0.5   PT/INR No results for input(s): LABPROT, INR in the last 72 hours.  Studies/Results: CT ABDOMEN PELVIS W CONTRAST  Result Date: 01/16/2022 CLINICAL DATA:  31 year old female with history of nausea and vomiting. EXAM: CT ABDOMEN AND PELVIS WITH CONTRAST TECHNIQUE:  Multidetector CT imaging of the abdomen and pelvis was performed using the standard protocol following bolus administration of intravenous contrast. RADIATION DOSE REDUCTION: This exam was performed according to the departmental dose-optimization program which includes automated exposure control, adjustment of the mA and/or kV according to patient size and/or use of iterative reconstruction technique. CONTRAST:  40mL OMNIPAQUE IOHEXOL 300 MG/ML  SOLN COMPARISON:  No priors. FINDINGS: Lower chest: Unremarkable. Hepatobiliary: No suspicious cystic or solid hepatic lesions. No intra or extrahepatic biliary ductal dilatation. Gallbladder is normal in appearance. Pancreas: No pancreatic mass. No pancreatic ductal dilatation. No pancreatic or peripancreatic fluid collections or inflammatory changes. Spleen: Unremarkable. Adrenals/Urinary Tract: Bilateral kidneys and adrenal glands are normal in appearance. No hydroureteronephrosis. Urinary bladder is unremarkable in appearance. Stomach/Bowel: Stomach is moderately distended. Duodenum also appears distended measuring up to 3.7 cm in diameter in the distal second and proximal third portion of the duodenum for transition beneath the  superior mesenteric artery, beyond which the small bowel appears largely decompressed. No other pathologic dilatation of small bowel or colon. The appendix is not confidently identified and may be surgically absent. Regardless, there are no inflammatory changes noted adjacent to the cecum to suggest the presence of an acute appendicitis at this time. Vascular/Lymphatic: No significant atherosclerotic disease, aneurysm or dissection noted in the abdominal or pelvic vasculature. Narrowed aortomesenteric angle of 9.7 degrees (normal 28-65 degrees) and decreased aortomesenteric distance of only 5 mm (normal 10-34 mm). No lymphadenopathy noted in the abdomen or pelvis. Reproductive: Uterus and ovaries are unremarkable in appearance. Other: Markedly  thin body habitus with paucity of intra mesenteric and retroperitoneal fat. No significant volume of ascites. No pneumoperitoneum. Musculoskeletal: There are no aggressive appearing lytic or blastic lesions noted in the visualized portions of the skeleton. IMPRESSION: 1. Extremely thin body habitus, with paucity of intra mesenteric and retroperitoneal fat. This is associated with marked narrowing of the third portion of the duodenum as it transitions beneath the superior mesenteric artery, with narrowed aortomesenteric angle and distance, as detailed above. Clinical correlation for signs and symptoms of superior mesenteric artery syndrome are recommended. 2. No other potential acute findings are noted elsewhere in the abdomen or pelvis to account for the patient's symptoms. Electronically Signed   By: Trudie Reed M.D.   On: 01/16/2022 05:32   US Abdomen Limited RUQ (LIVER/GB)  Result Date: 01/16/2022 CLINICAL DATA:  Epigastric pain for 5 days EXAM: ULTRASOUND ABDOMEN LIMITED RIGHT UPPER QUADRANT COMPARISON:  None Available. FINDINGS: Gallbladder: No gallstones or wall thickening visualized. No sonographic Murphy sign noted by sonographer. Common bile duct: Diameter: 4.7 mm Liver: No focal lesion identified. Within normal limits in parenchymal echogenicity. Portal vein is patent on color Doppler imaging with normal direction of blood flow towards the liver. Other: None. IMPRESSION: Negative examination Electronically Signed   By: Jasmine Pang M.D.   On: 01/16/2022 03:43    Impression: Nausea, vomiting, 15 pound weight loss in 4 weeks  CT abdomen: Marked narrowing of third portion of duodenum as it transitions beneath the superior mesenteric artery with narrowed aortomesenteric angle and distance, associated with moderately distended stomach, distended duodenum up to 3.7 cm in distal second and proximal third portion Extremely thin body habitus with paucity of intra mesenteric and retroperitoneal  fat  Normal electrolytes, normal renal function, normal T. 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 Recommend NG tube placement, keep to low intermittent wall suction. Supportive management-IV fluids for now. Plan EGD in a.m. Surgery has been consulted.   LOS: 0 days   Kerin Salen, MD  01/16/2022, 11:01 AM

## 2022-01-16 NOTE — ED Triage Notes (Signed)
Patient has been having abdominal pain for 5 days. Pain right above her belly button. Hurts after she eats after every meal. Has been vomiting every day. Able to have bowel movements.

## 2022-01-16 NOTE — ED Provider Notes (Signed)
[facility]-EMERGENCY DEPT Provider Note   CSN: [id number] Arrival date & time: 01/16/22  0033     History  Chief Complaint  Patient presents with   Abdominal Pain    Mariah Lozano is a 31 y.o. female.  HPI     This is a 31 year old female who presents with abdominal pain, nausea, vomiting.  Patient describes 5-day history of worsening epigastric abdominal pain.  She states it is worse with eating.  She states she has had increasing stress at home and "I do not take care of myself."  She states that she takes ecstasy daily.  No alcohol use.  She is having normal bowel movements but has had nonbilious, nonbloody emesis.  Home Medications Prior to Admission medications   Medication Sig Start Date End Date Taking? Authorizing Provider  QUEtiapine (SEROQUEL) 200 MG tablet Take 200 mg by mouth at bedtime.   Yes [provider]      Allergies    Patient has no known allergies.    Review of Systems   Review of Systems  Constitutional:  Negative for fever.  Gastrointestinal:  Positive for abdominal pain and vomiting. Negative for diarrhea and nausea.  All other systems reviewed and are negative.  Physical Exam Updated Vital Signs BP 112/71   Pulse 65   Temp 97.6 F (36.4 C) (Oral)   Resp 16   Ht 1.702 m (5\' 7" )   Wt 49 kg   SpO2 100%   BMI 16.92 kg/m  Physical Exam Vitals and nursing note reviewed.  Constitutional:      Appearance: She is well-developed.  HENT:     Head: Normocephalic and atraumatic.  Eyes:     Pupils: Pupils are equal, round, and reactive to light.  Cardiovascular:     Rate and Rhythm: Normal rate and regular rhythm.  Pulmonary:     Effort: Pulmonary effort is normal. No respiratory distress.     Breath sounds: No wheezing.  Abdominal:     General: Bowel sounds are normal.     Palpations: Abdomen is soft.     Tenderness: There is abdominal tenderness in the right upper quadrant and epigastric area. There is  no guarding or rebound.  Musculoskeletal:     Cervical back: Neck supple.  Skin:    General: Skin is warm and dry.  Neurological:     Mental Status: She is alert and oriented to person, place, and time.    ED Results / Procedures / Treatments   Labs (all labs ordered are listed, but only abnormal results are displayed) Labs Reviewed  COMPREHENSIVE METABOLIC PANEL - Abnormal; Notable for the following components:      Result Value   Glucose, Bld 101 (*)    All other components within normal limits  CBC - Abnormal; Notable for the following components:   WBC 11.2 (*)    All other components within normal limits  URINALYSIS, ROUTINE W REFLEX MICROSCOPIC - Abnormal; Notable for the following components:   APPearance CLOUDY (*)    pH 9.0 (*)    Protein, ur 30 (*)    Bacteria, UA RARE (*)    All other components within normal limits  LIPASE, BLOOD  I-STAT BETA HCG BLOOD, ED ([facility], [facility], [facility] ONLY)    EKG None  Radiology CT ABDOMEN PELVIS W CONTRAST  Result Date: 01/16/2022 CLINICAL DATA:  31 year old female with history of nausea and vomiting. EXAM: CT ABDOMEN AND PELVIS WITH CONTRAST TECHNIQUE: Multidetector CT imaging  of the abdomen and pelvis was performed using the standard protocol following bolus administration of intravenous contrast. RADIATION DOSE REDUCTION: This exam was performed according to the departmental dose-optimization program which includes automated exposure control, adjustment of the mA and/or kV according to patient size and/or use of iterative reconstruction technique. CONTRAST:  68mL OMNIPAQUE IOHEXOL 300 MG/ML  SOLN COMPARISON:  No priors. FINDINGS: Lower chest: Unremarkable. Hepatobiliary: No suspicious cystic or solid hepatic lesions. No intra or extrahepatic biliary ductal dilatation. Gallbladder is normal in appearance. Pancreas: No pancreatic mass. No pancreatic ductal dilatation. No pancreatic or peripancreatic fluid collections or inflammatory changes. Spleen:  Unremarkable. Adrenals/Urinary Tract: Bilateral kidneys and adrenal glands are normal in appearance. No hydroureteronephrosis. Urinary bladder is unremarkable in appearance. Stomach/Bowel: Stomach is moderately distended. Duodenum also appears distended measuring up to 3.7 cm in diameter in the distal second and proximal third portion of the duodenum for transition beneath the superior mesenteric artery, beyond which the small bowel appears largely decompressed. No other pathologic dilatation of small bowel or colon. The appendix is not confidently identified and may be surgically absent. Regardless, there are no inflammatory changes noted adjacent to the cecum to suggest the presence of an acute appendicitis at this time. Vascular/Lymphatic: No significant atherosclerotic disease, aneurysm or dissection noted in the abdominal or pelvic vasculature. Narrowed aortomesenteric angle of 9.7 degrees (normal 28-65 degrees) and decreased aortomesenteric distance of only 5 mm (normal 10-34 mm). No lymphadenopathy noted in the abdomen or pelvis. Reproductive: Uterus and ovaries are unremarkable in appearance. Other: Markedly thin body habitus with paucity of intra mesenteric and retroperitoneal fat. No significant volume of ascites. No pneumoperitoneum. Musculoskeletal: There are no aggressive appearing lytic or blastic lesions noted in the visualized portions of the skeleton. IMPRESSION: 1. Extremely thin body habitus, with paucity of intra mesenteric and retroperitoneal fat. This is associated with marked narrowing of the third portion of the duodenum as it transitions beneath the superior mesenteric artery, with narrowed aortomesenteric angle and distance, as detailed above. Clinical correlation for signs and symptoms of superior mesenteric artery syndrome are recommended. 2. No other potential acute findings are noted elsewhere in the abdomen or pelvis to account for the patient's symptoms. Electronically Signed   By:  Trudie Reed M.D.   On: 01/16/2022 05:32   US Abdomen Limited RUQ (LIVER/GB)  Result Date: 01/16/2022 CLINICAL DATA:  Epigastric pain for 5 days EXAM: ULTRASOUND ABDOMEN LIMITED RIGHT UPPER QUADRANT COMPARISON:  None Available. FINDINGS: Gallbladder: No gallstones or wall thickening visualized. No sonographic Murphy sign noted by sonographer. Common bile duct: Diameter: 4.7 mm Liver: No focal lesion identified. Within normal limits in parenchymal echogenicity. Portal vein is patent on color Doppler imaging with normal direction of blood flow towards the liver. Other: None. IMPRESSION: Negative examination Electronically Signed   By: Jasmine Pang M.D.   On: 01/16/2022 03:43    Procedures .Critical Care Performed by: Shon Baton, MD Authorized by: Shon Baton, MD   Critical care provider statement:    Critical care time (minutes):  30   Critical care was necessary to treat or prevent imminent or life-threatening deterioration of the following conditions:  Dehydration   Critical care was time spent personally by me on the following activities:  Development of treatment plan with patient or surrogate, discussions with consultants, evaluation of patient's response to treatment, examination of patient, ordering and review of laboratory studies, ordering and review of radiographic studies, ordering and performing treatments and interventions, pulse oximetry, re-evaluation of  patient's condition and review of old charts    Medications Ordered in ED Medications  0.9 %  sodium chloride infusion (has no administration in time range)  alum & mag hydroxide-simeth (MAALOX/MYLANTA) 200-200-20 MG/5ML suspension 30 mL (30 mLs Oral Given 01/16/22 0355)    And  lidocaine (XYLOCAINE) 2 % viscous mouth solution 15 mL (15 mLs Oral Given 01/16/22 0355)  ondansetron (ZOFRAN-ODT) disintegrating tablet 4 mg (4 mg Oral Given 01/16/22 0356)  sodium chloride 0.9 % bolus 1,000 mL (0 mLs Intravenous Stopped  01/16/22 0558)  ondansetron (ZOFRAN) injection 4 mg (4 mg Intravenous Given 01/16/22 0435)  pantoprazole (PROTONIX) injection 40 mg (40 mg Intravenous Given 01/16/22 0435)  iohexol (OMNIPAQUE) 300 MG/ML solution 100 mL (80 mLs Intravenous Contrast Given 01/16/22 0504)    ED Course/ Medical Decision Making/ A&P                           Medical Decision Making Amount and/or Complexity of Data Reviewed Labs: ordered. Radiology: ordered.  Risk OTC drugs. Prescription drug management. Decision regarding hospitalization.   This patient presents to the ED for concern of nausea, vomiting, abdominal pain, this involves an extensive number of treatment options, and is a complaint that carries with it a high risk of complications and morbidity.  I considered the following differential and admission for this acute, potentially life threatening condition.  The differential diagnosis includes gastritis, cholecystitis, gastroenteritis, peptic ulcer disease, less likely appendicitis or obstructive pathology  MDM:    This is a 31 year old female who presents with nausea, vomiting, abdominal pain.  Nontoxic-appearing and vital signs are reassuring.  She has epigastric tenderness to palpation as well as right upper quadrant tenderness.  Patient was given fluids, pain, nausea medication.  Labs obtained.  No significant leukocytosis.  Normal LFTs and lipase.  Urinalysis is negative for UTI.  Initially right upper quadrant ultrasound was negative for gallbladder pathology.  Initially attempted p.o. challenge.  Patient was unable to tolerate any thing by mouth and had emesis.  For this reason, CT scan was pursued.  CT scan independently reviewed by myself.  Patient had markedly enlarged stomach.  There was air throughout the colon.  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  Secure chatted Dr. Pati Gallo, gastroenterology.  She will evaluate patient.  Agrees with NG tube  placement and fluids.  Additionally, surgical consult placed with Dr. Freida Busman.  They will see patient as well.  No additional recommendations at this time.  (Labs, imaging, consults)  Labs: I Ordered, and personally interpreted labs.  The pertinent results include: CBC, CMP, lipase, urinalysis  Imaging Studies ordered: I ordered imaging studies including right upper quadrant ultrasound, CT I independently visualized and interpreted imaging. I agree with the radiologist interpretation  Additional history obtained from chart review.  External records from outside source obtained and reviewed including outpatient visits  Cardiac Monitoring: The patient was maintained on a cardiac monitor.  I personally viewed and interpreted the cardiac monitored which showed an underlying rhythm of: Normal sinus rhythm  Reevaluation: After the interventions noted above, I reevaluated the patient and found that they have :improved  Social Determinants of Health: Lives independently  Disposition: Admit  Co morbidities that complicate the patient evaluation History reviewed. No pertinent past medical history.   Medicines Meds ordered this encounter  Medications   AND Linked Order Group    alum & mag hydroxide-simeth (MAALOX/MYLANTA) 200-200-20  MG/5ML suspension 30 mL    lidocaine (XYLOCAINE) 2 % viscous mouth solution 15 mL   ondansetron (ZOFRAN-ODT) disintegrating tablet 4 mg   sodium chloride 0.9 % bolus 1,000 mL   ondansetron (ZOFRAN) injection 4 mg   pantoprazole (PROTONIX) injection 40 mg   iohexol (OMNIPAQUE) 300 MG/ML solution 100 mL   0.9 %  sodium chloride infusion    I have reviewed the patients home medicines and have made adjustments as needed  Problem List / ED Course: Problem List Items Addressed This Visit   None Visit Diagnoses     Epigastric pain    -  Primary   Nausea and vomiting, unspecified vomiting type                       Final Clinical Impression(s)  / ED Diagnoses Final diagnoses:  Epigastric pain  Nausea and vomiting, unspecified vomiting type    Rx / DC Orders ED Discharge Orders     None         Shon BatonHorton, Gamal Todisco F, MD 01/16/22 [phone]

## 2022-01-16 NOTE — Progress Notes (Signed)
[facility] admitting physician addendum:  The nursing staff reported that the patient has been very anxious since she arrived to the unit.  She stated this morning that she did not have a any past medical history, but told the staff now that she suffers from panic attacks and they have noticed that she is very restless.  Earlier, she was unable to tolerate the NG insertion.  Lorazepam 1 mg IVP x1 dose now ordered.  We will see if we are able to insert it after lorazepam given.  Sanda Klein, MD

## 2022-01-16 NOTE — Consult Note (Signed)
[place] [birth year]  OR:[id number].    Requesting MD: Dr. Dina Rich Chief Complaint/Reason for Consult: abdominal pain, possible SMA syndrome  HPI:  Mariah Lozano is a 31 year old female who presented to the ED with abdominal pain.  She says the pain began about 5 to 6 days ago and is gotten progressively worse.  She has never had pain like this before.  It is also associated with nausea and vomiting, and the pain gets worse after she eats.  She reports unintentional weight loss over the last few months.  She says she has had a lot of emotional stressors after ending a long-term relationship.  Labs in the ED are overall unremarkable.  RUQ US showed no cholelithiasis or signs of acute cholecystitis.  A CT scan showed distention of the stomach and mild dilation of the proximal duodenum, and was read as suspicious for SMA syndrome.  The patient has been admitted to the hospitalist service and GI has been consulted.  She is otherwise in good health and has not had any prior abdominal surgeries.  ROS: Review of Systems  Constitutional:  Positive for weight loss. Negative for chills and fever.  Gastrointestinal:  Positive for abdominal pain, nausea and vomiting.  Neurological:  Negative for loss of consciousness and weakness.   History reviewed. No pertinent family history.  History reviewed. No pertinent past medical history.  History reviewed. No pertinent surgical history.  Social History:  reports that she has been smoking. She has never used smokeless tobacco. She reports current drug use. Drug: Marijuana. She reports that she does not drink alcohol.  Allergies: No Known Allergies  (Not in a hospital admission)    Physical Exam: Blood pressure 112/71, pulse 65, temperature 97.6 F (36.4 C), temperature source Oral, resp. rate 16, height 5\' 7"  (1.702 m), weight 49 kg, SpO2 100 %. General: resting comfortably, appears stated age, no apparent distress Neurological: alert and  oriented, no focal deficits, cranial nerves grossly in tact HEENT: normocephalic, atraumatic CV: regular rate and rhythm, extremities warm and well-perfused Respiratory: normal work of breathing on room air, symmetric chest wall expansion Abdomen: soft, nondistended, nontender to palpation. No masses or organomegaly. Extremities: warm and well-perfused, no deformities, moving all extremities spontaneously Psychiatric: normal mood and affect Skin: warm and dry, no jaundice, no rashes or lesions   Results for orders placed or performed during the hospital encounter of 01/16/22 (from the past 48 hour(s))  Lipase, blood     Status: None   Collection Time: 01/16/22 12:42 AM  Result Value Ref Range   Lipase 24 11 - 51 U/L    Comment: Performed at [facility], [place] [address], [place], [place] [address]  Comprehensive metabolic panel     Status: Abnormal   Collection Time: 01/16/22 12:42 AM  Result Value Ref Range   Sodium 141 135 - 145 mmol/L   Potassium 3.8 3.5 - 5.1 mmol/L   Chloride 109 98 - 111 mmol/L   CO2 27 22 - 32 mmol/L   Glucose, Bld 101 (H) 70 - 99 mg/dL    Comment: Glucose reference range applies only to samples taken after fasting for at least 8 hours.   BUN 11 6 - 20 mg/dL   Creatinine, Ser 0.66 0.44 - 1.00 mg/dL   Calcium 8.9 8.9 - 10.3 mg/dL   Total Protein 7.0 6.5 - 8.1 g/dL   Albumin 4.4 3.5 - 5.0 g/dL   AST 16 15 - 41 U/L   ALT 19  0 - 44 U/L   Alkaline Phosphatase 43 38 - 126 U/L   Total Bilirubin 0.5 0.3 - 1.2 mg/dL   GFR, Estimated >60 >60 mL/min    Comment: (NOTE) Calculated using the CKD-EPI Creatinine Equation (2021)    Anion gap 5 5 - 15    Comment: Performed at [facility], [place] [address] [place], [place] [address]  CBC     Status: Abnormal   Collection Time: 01/16/22 12:42 AM  Result Value Ref Range   WBC 11.2 (H) 4.0 - 10.5 K/uL   RBC 4.38 3.87 - 5.11 MIL/uL   Hemoglobin 14.0 12.0 - 15.0 g/dL   HCT 41.7 36.0 -  46.0 %   MCV 95.2 80.0 - 100.0 fL   MCH 32.0 26.0 - 34.0 pg   MCHC 33.6 30.0 - 36.0 g/dL   RDW 13.7 11.5 - 15.5 %   Platelets 364 150 - 400 K/uL   nRBC 0.0 0.0 - 0.2 %    Comment: Performed at [facility], [place] [address], [place], [place] [address]  I-Stat beta hCG blood, ED     Status: None   Collection Time: 01/16/22  1:21 AM  Result Value Ref Range   I-stat hCG, quantitative <5.0 <5 mIU/mL   Comment 3            Comment:   GEST. AGE      CONC.  (mIU/mL)   <=1 WEEK        5 - 50     2 WEEKS       50 - 500     3 WEEKS       100 - 10,000     4 WEEKS     1,000 - 30,000        FEMALE AND NON-PREGNANT FEMALE:     LESS THAN 5 mIU/mL   Urinalysis, Routine w reflex microscopic Urine, Clean Catch     Status: Abnormal   Collection Time: 01/16/22  1:29 AM  Result Value Ref Range   Color, Urine YELLOW YELLOW   APPearance CLOUDY (A) CLEAR   Specific Gravity, Urine 1.023 1.005 - 1.030   pH 9.0 (H) 5.0 - 8.0   Glucose, UA NEGATIVE NEGATIVE mg/dL   Hgb urine dipstick NEGATIVE NEGATIVE   Bilirubin Urine NEGATIVE NEGATIVE   Ketones, ur NEGATIVE NEGATIVE mg/dL   Protein, ur 30 (A) NEGATIVE mg/dL   Nitrite NEGATIVE NEGATIVE   Leukocytes,Ua NEGATIVE NEGATIVE   RBC / HPF 0-5 0 - 5 RBC/hpf   Bacteria, UA RARE (A) NONE SEEN   Squamous Epithelial / LPF 0-5 0 - 5   Mucus PRESENT    Budding Yeast PRESENT    Amorphous Crystal PRESENT     Comment: Performed at [facility], [place] [address], [place], [place] [address]   CT ABDOMEN PELVIS W CONTRAST  Result Date: 01/16/2022 CLINICAL DATA:  31 year old female with history of nausea and vomiting. EXAM: CT ABDOMEN AND PELVIS WITH CONTRAST TECHNIQUE: Multidetector CT imaging of the abdomen and pelvis was performed using the standard protocol following bolus administration of intravenous contrast. RADIATION DOSE REDUCTION: This exam was performed according to the departmental dose-optimization program which includes  automated exposure control, adjustment of the mA and/or kV according to patient size and/or use of iterative reconstruction technique. CONTRAST:  29mL OMNIPAQUE IOHEXOL 300 MG/ML  SOLN COMPARISON:  No priors. FINDINGS: Lower chest: Unremarkable. Hepatobiliary: No suspicious cystic or solid hepatic lesions. No intra  or extrahepatic biliary ductal dilatation. Gallbladder is normal in appearance. Pancreas: No pancreatic mass. No pancreatic ductal dilatation. No pancreatic or peripancreatic fluid collections or inflammatory changes. Spleen: Unremarkable. Adrenals/Urinary Tract: Bilateral kidneys and adrenal glands are normal in appearance. No hydroureteronephrosis. Urinary bladder is unremarkable in appearance. Stomach/Bowel: Stomach is moderately distended. Duodenum also appears distended measuring up to 3.7 cm in diameter in the distal second and proximal third portion of the duodenum for transition beneath the superior mesenteric artery, beyond which the small bowel appears largely decompressed. No other pathologic dilatation of small bowel or colon. The appendix is not confidently identified and may be surgically absent. Regardless, there are no inflammatory changes noted adjacent to the cecum to suggest the presence of an acute appendicitis at this time. Vascular/Lymphatic: No significant atherosclerotic disease, aneurysm or dissection noted in the abdominal or pelvic vasculature. Narrowed aortomesenteric angle of 9.7 degrees (normal 28-65 degrees) and decreased aortomesenteric distance of only 5 mm (normal 10-34 mm). No lymphadenopathy noted in the abdomen or pelvis. Reproductive: Uterus and ovaries are unremarkable in appearance. Other: Markedly thin body habitus with paucity of intra mesenteric and retroperitoneal fat. No significant volume of ascites. No pneumoperitoneum. Musculoskeletal: There are no aggressive appearing lytic or blastic lesions noted in the visualized portions of the skeleton. IMPRESSION: 1.  Extremely thin body habitus, with paucity of intra mesenteric and retroperitoneal fat. This is associated with marked narrowing of the third portion of the duodenum as it transitions beneath the superior mesenteric artery, with narrowed aortomesenteric angle and distance, as detailed above. Clinical correlation for signs and symptoms of superior mesenteric artery syndrome are recommended. 2. No other potential acute findings are noted elsewhere in the abdomen or pelvis to account for the patient's symptoms. Electronically Signed   By: Vinnie Langton M.D.   On: 01/16/2022 05:32   US Abdomen Limited RUQ (LIVER/GB)  Result Date: 01/16/2022 CLINICAL DATA:  Epigastric pain for 5 days EXAM: ULTRASOUND ABDOMEN LIMITED RIGHT UPPER QUADRANT COMPARISON:  None Available. FINDINGS: Gallbladder: No gallstones or wall thickening visualized. No sonographic Murphy sign noted by sonographer. Common bile duct: Diameter: 4.7 mm Liver: No focal lesion identified. Within normal limits in parenchymal echogenicity. Portal vein is patent on color Doppler imaging with normal direction of blood flow towards the liver. Other: None. IMPRESSION: Negative examination Electronically Signed   By: Donavan Foil M.D.   On: 01/16/2022 03:43      Assessment/Plan This is a 31 year old female presenting with epigastric pain, nausea, and vomiting.  I personally reviewed her CT scan.  There is mild dilation of the third portion of the duodenum, as well as gastric distention.  The patient has a low BMI and has had weight loss over the last few months.  True SMA syndrome is uncommon but is associated with weight loss.  This is a diagnosis of exclusion and the patient will need further work-up.  Recommend an upper GI after gastric decompression.  Agree with GI consult for consideration of an EGD. Surgery will follow.   Michaelle Birks, MD [facility] General, Hepatobiliary and Pancreatic Surgery 01/16/22 7:57 AM

## 2022-01-17 ENCOUNTER — Inpatient Hospital Stay (HOSPITAL_COMMUNITY): Payer: Medicaid Other | Admitting: Anesthesiology

## 2022-01-17 ENCOUNTER — Encounter (HOSPITAL_COMMUNITY)
Admission: EM | Disposition: A | Discharge status: Home / Self Care | Payer: Self-pay | Source: Home / Self Care | Attending: Internal Medicine

## 2022-01-17 ENCOUNTER — Encounter (HOSPITAL_COMMUNITY): Payer: Self-pay | Admitting: Internal Medicine

## 2022-01-17 DIAGNOSIS — R1013 Epigastric pain: Secondary | ICD-10-CM

## 2022-01-17 DIAGNOSIS — K269 Duodenal ulcer, unspecified as acute or chronic, without hemorrhage or perforation: Secondary | ICD-10-CM

## 2022-01-17 DIAGNOSIS — K551 Chronic vascular disorders of intestine: Secondary | ICD-10-CM | POA: Diagnosis not present

## 2022-01-17 DIAGNOSIS — R112 Nausea with vomiting, unspecified: Secondary | ICD-10-CM

## 2022-01-17 DIAGNOSIS — K449 Diaphragmatic hernia without obstruction or gangrene: Secondary | ICD-10-CM

## 2022-01-17 DIAGNOSIS — D72829 Elevated white blood cell count, unspecified: Secondary | ICD-10-CM

## 2022-01-17 HISTORY — PX: BIOPSY: SHX5522

## 2022-01-17 HISTORY — PX: ESOPHAGOGASTRODUODENOSCOPY (EGD) WITH PROPOFOL: SHX5813

## 2022-01-17 LAB — COMPREHENSIVE METABOLIC PANEL
ALT: 15 U/L (ref 0–44)
AST: 14 U/L — ABNORMAL LOW (ref 15–41)
Albumin: 3.4 g/dL — ABNORMAL LOW (ref 3.5–5.0)
Alkaline Phosphatase: 38 U/L (ref 38–126)
Anion gap: 4 — ABNORMAL LOW (ref 5–15)
BUN: 6 mg/dL (ref 6–20)
CO2: 23 mmol/L (ref 22–32)
Calcium: 8.3 mg/dL — ABNORMAL LOW (ref 8.9–10.3)
Chloride: 112 mmol/L — ABNORMAL HIGH (ref 98–111)
Creatinine, Ser: 0.65 mg/dL (ref 0.44–1.00)
GFR, Estimated: >60 mL/min (ref >60)
Glucose, Bld: 84 mg/dL (ref 70–99)
Potassium: 4.4 mmol/L (ref 3.5–5.1)
Sodium: 139 mmol/L (ref 135–145)
Total Bilirubin: 0.8 mg/dL (ref 0.3–1.2)
Total Protein: 5.7 g/dL — ABNORMAL LOW (ref 6.5–8.1)

## 2022-01-17 LAB — CBC
HCT: 38.8 % (ref 36.0–46.0)
Hemoglobin: 13.1 g/dL (ref 12.0–15.0)
MCH: 32.5 pg (ref 26.0–34.0)
MCHC: 33.8 g/dL (ref 30.0–36.0)
MCV: 96.3 fL (ref 80.0–100.0)
Platelets: 323 10*3/uL (ref 150–400)
RBC: 4.03 MIL/uL (ref 3.87–5.11)
RDW: 14 % (ref 11.5–15.5)
WBC: 8.7 10*3/uL (ref 4.0–10.5)
nRBC: 0 % (ref 0.0–0.2)

## 2022-01-17 LAB — MAGNESIUM: Magnesium: 2.3 mg/dL (ref 1.7–2.4)

## 2022-01-17 SURGERY — ESOPHAGOGASTRODUODENOSCOPY (EGD) WITH PROPOFOL
Anesthesia: Monitor Anesthesia Care

## 2022-01-17 MED ORDER — PROPOFOL 10 MG/ML IV BOLUS
INTRAVENOUS | Status: DC | PRN
  Administered 2022-01-17: 40 mg via INTRAVENOUS
  Administered 2022-01-17: 25 mg via INTRAVENOUS

## 2022-01-17 MED ORDER — MIDAZOLAM HCL 5 MG/5ML IJ SOLN
INTRAMUSCULAR | Status: DC | PRN
  Administered 2022-01-17: 2 mg via INTRAVENOUS

## 2022-01-17 MED ORDER — PROPOFOL 500 MG/50ML IV EMUL
INTRAVENOUS | Status: DC | PRN
  Administered 2022-01-17: 200 ug/kg/min via INTRAVENOUS

## 2022-01-17 MED ORDER — QUETIAPINE FUMARATE 200 MG PO TABS
200.0000 mg | ORAL_TABLET | Freq: Every day | ORAL | Status: DC
  Administered 2022-01-17: 200 mg via ORAL
  Filled 2022-01-17: qty 1

## 2022-01-17 MED ORDER — PANTOPRAZOLE SODIUM 40 MG PO TBEC: 40.0000 mg | DELAYED_RELEASE_TABLET | Freq: Two times a day (BID) | ORAL | Status: DC

## 2022-01-17 MED ORDER — PANTOPRAZOLE SODIUM 40 MG PO TBEC
40.0000 mg | DELAYED_RELEASE_TABLET | Freq: Two times a day (BID) | ORAL | Status: DC
  Administered 2022-01-17 – 2022-01-18 (×3): 40 mg via ORAL
  Filled 2022-01-17 (×3): qty 1

## 2022-01-17 MED ORDER — PROPOFOL 1000 MG/100ML IV EMUL
INTRAVENOUS | Status: AC
  Filled 2022-01-17: qty 100

## 2022-01-17 MED ORDER — PROPOFOL 500 MG/50ML IV EMUL
INTRAVENOUS | Status: AC
  Filled 2022-01-17: qty 50

## 2022-01-17 MED ORDER — LIDOCAINE 2% (20 MG/ML) 5 ML SYRINGE
INTRAMUSCULAR | Status: DC | PRN
  Administered 2022-01-17: 60 mg via INTRAVENOUS

## 2022-01-17 MED ORDER — MIDAZOLAM HCL 2 MG/2ML IJ SOLN
INTRAMUSCULAR | Status: AC
  Filled 2022-01-17: qty 2

## 2022-01-17 MED ORDER — LACTATED RINGERS IV SOLN: INTRAVENOUS | Status: DC | PRN

## 2022-01-17 SURGICAL SUPPLY — 15 items

## 2022-01-17 NOTE — Progress Notes (Signed)
PROGRESS NOTE    Mariah Lozano  ZOX:[id number] DOB: 09/25/1990 DOA: 01/16/2022 PCP: [facility]   Brief Narrative:  The patient is a 31 year old female with no real past medical history who comes to the emergency department with chief complaint of abdominal pain for last 5 days that worsened with food intake and associated daily nausea and emesis.  She had no other gastrointestinal symptoms and in the ED she is found to be afebrile with a pulse of 80 and respirations 18 and saturating 100%.  She received Maalox, ondansetron orally and IV, PPI as well as a normal saline bolus.  Further work-up showed that she had a cloudy urinalysis with a white count of 11.2.  Right upper quadrant ultrasound was done a CT of the abdomen pelvis showed an extremely thin body habitus with positive intra mesenteric and retroperitoneal fat there is associated marked narrowing of the third portion of the duodenum as it transitions between the superior mesenteric artery with no other acute findings.  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  IV fluids were provided and GI was consulted and they recommended NGT to suction but was never placed.  She is provided with analgesics and underwent an EGD today which showed that the gastric and cardia fundus were normal on retroflexion but she did have a nonbleeding cratered duodenal ulcer with a clean ulcer base in the duodenal bulb.  She had biopsies taken with cold forceps biopsies were taken for H. pylori as well as further evaluation of celiac disease.  General surgery was consulted and recommended EGD and felt that the diagnosis of true SMA syndrome is a diagnosis of exclusion.  After her EGD GI started her on a on low fiber diet and pantoprazole 40 mg twice a day, to be continued for 2 months.  They felt no need for NG tube placement now and her fluids were  discontinued.  Assessment and Plan:  Epigastric pain with nausea, vomiting with concern for SMA syndrome -CT scan was done and showed a mild dilation of the third portion of the duodenum as well as gastric distention -Patient has lost weight last few weeks -Nurses kept n.p.o. for EGD and GI recommended NGT suction but she refused this -GI and general surgery were consulted -General surgery felt the true SMA syndrome is a diagnosis of exclusion and recommended further work-up with upper GI after gastric decompression -EGD was done and did show a cratered duodenal nonbleeding ulcer  -GI recommended starting the patient on a low fiber diet and using PPI twice daily as well as waiting for pathology results given that biopsies were taken for evaluation for celiac as well as H. pylori -Appreciate both general surgery as well as gastroenterology evaluations and will continue to monitor and trend electrolytes and continue with antiemetics and analgesics as necessary - IV fluids and now been discontinued given that the patient is tolerating oral intake well -WBC went from 11.2 and likely reactive and is now 8.7 -He is getting pain control with acetaminophen and hydromorphone  Panic attacks and generalized anxiety disorder -Received IV lorazepam -Continue to monitor and may need some hydroxyzine -Continue with her home Seroquel 20 mg p.o. nightly  History of drug use including ecstasy -Consider obtaining UDS -Denies alcohol abuse  Leukocytosis -Mild and likely reactive is improved.  Patient is WBC went from 11.2 is now 8.7 -Repeat CBC in a.m. and monitor for  signs and symptoms of infection  Hypoalbuminemia -Patient's albumin level went from 4.4 is now 3.4 -Continue to monitor and trend and repeat CMP in the a.m.  Underweight -Consult nutritionist for further evaluation  DVT prophylaxis: SCDs Start: 01/16/22 0608    Code Status: Full Code Family Communication: No family currently at  bedside  Disposition Plan:  Level of care: Telemetry Status is: Inpatient Remains inpatient appropriate because: Anticipate discharging home in the next 24 to 48 hours once cleared by general surgery as well as gastroenterology   Consultants:  General surgery Gastroenterology  Procedures:  EGD Findings:      The examined esophagus was normal.      The Z-line was regular and was found 35 cm from the incisors.      The entire examined stomach was normal. Biopsies were taken with a cold       forceps for Helicobacter pylori testing.      The cardia and gastric fundus were normal on retroflexion.      One non-bleeding cratered duodenal ulcer with a clean ulcer base       (Forrest Class III) was found in the duodenal bulb. The lesion was 12 mm       in largest dimension.      Biopsies for histology were taken with a cold forceps in the duodenal       bulb, in the first portion of the duodenum and in the second portion of       the duodenum for evaluation of celiac disease.      A 2 cm hiatal hernia was present. Impression:               - Normal esophagus.                           - Z-line regular, 35 cm from the incisors.                           - Normal stomach. Biopsied.                           - Non-bleeding duodenal ulcer with a clean ulcer                            base (Forrest Class III).                           - 2 cm hiatal hernia.                           - Biopsies were taken with a cold forceps for                            evaluation of celiac disease. Moderate Sedation:      Patient did not receive moderate sedation for this procedure, but       instead received monitored anesthesia care. Recommendation:           - Low fiber diet.                           - Use Protonix (pantoprazole) 40 mg PO BID for 2  months.                           - Await pathology results. Antimicrobials:  Anti-infectives (From admission, onward)     None       Subjective: Seen and examined at bedside after her EGD and she was feeling sleepy and wanted to rest and did not really want to interact and she wanted to sleep.  Felt okay and denied any complaints.  No lightheadedness or dizziness.  No other concerns or plaints this time  Objective: Vitals:   01/17/22 0840 01/17/22 0850 01/17/22 0906 01/17/22 1340  BP: (!) 97/52 (!) 95/59 100/66 113/75  Pulse: 60 67 64 78  Resp: (!) 23 20 14 16   Temp:   97.6 F (36.4 C) 97.8 F (36.6 C)  TempSrc:   Oral Oral  SpO2: 100% 98% 100% 100%  Weight:      Height:        Intake/Output Summary (Last 24 hours) at 01/17/2022 1757 Last data filed at 01/17/2022 1449 Gross per 24 hour  Intake 2380.28 ml  Output --  Net 2380.28 ml   Filed Weights   01/16/22 0040  Weight: 49 kg   Examination: Physical Exam:  Constitutional: Thin female currently no acute distress appears calm and slightly sleepy Respiratory: Diminished to auscultation bilaterally, no wheezing, rales, rhonchi or crackles. Normal respiratory effort and patient is not tachypenic. No accessory muscle use.  Cardiovascular: RRR, no murmurs / rubs / gallops. S1 and S2 auscultated. No extremity edema. Abdomen: Soft, non-tender, non-distended.  Bowel sounds positive.  GU: Deferred. Musculoskeletal: No clubbing / cyanosis of digits/nails. No joint deformity upper and lower extremities. Neurologic: CN 2-12 grossly intact with no focal deficits. Psychiatric: Normal judgment and insight.  She is a little somnolent but arouses to questioning  Data Reviewed: I have personally reviewed following labs and imaging studies  CBC: Recent Labs  Lab 01/16/22 0042 01/17/22 0659  WBC 11.2* 8.7  HGB 14.0 13.1  HCT 41.7 38.8  MCV 95.2 96.3  PLT 364 323   Basic Metabolic Panel: Recent Labs  Lab 01/16/22 0042 01/17/22 0659  NA 141 139  K 3.8 4.4  CL 109 112*  CO2 27 23  GLUCOSE 101* 84  BUN 11 6  CREATININE 0.66 0.65  CALCIUM 8.9 8.3*   MG  --  2.3   GFR: Estimated Creatinine Clearance: 77.4 mL/min (by C-G formula based on SCr of 0.65 mg/dL). Liver Function Tests: Recent Labs  Lab 01/16/22 0042 01/17/22 0659  AST 16 14*  ALT 19 15  ALKPHOS 43 38  BILITOT 0.5 0.8  PROT 7.0 5.7*  ALBUMIN 4.4 3.4*   Recent Labs  Lab 01/16/22 0042  LIPASE 24   No results for input(s): AMMONIA in the last 168 hours. Coagulation Profile: No results for input(s): INR, PROTIME in the last 168 hours. Cardiac Enzymes: No results for input(s): CKTOTAL, CKMB, CKMBINDEX, TROPONINI in the last 168 hours. BNP (last 3 results) No results for input(s): PROBNP in the last 8760 hours. HbA1C: No results for input(s): HGBA1C in the last 72 hours. CBG: No results for input(s): GLUCAP in the last 168 hours. Lipid Profile: No results for input(s): CHOL, HDL, LDLCALC, TRIG, CHOLHDL, LDLDIRECT in the last 72 hours. Thyroid Function Tests: No results for input(s): TSH, T4TOTAL, FREET4, T3FREE, THYROIDAB in the last 72 hours. Anemia Panel: No results for input(s): VITAMINB12, FOLATE, FERRITIN, TIBC, IRON, RETICCTPCT in the last  72 hours. Sepsis Labs: No results for input(s): PROCALCITON, LATICACIDVEN in the last 168 hours.  No results found for this or any previous visit (from the past 240 hour(s)).   Radiology Studies: CT ABDOMEN PELVIS W CONTRAST  Result Date: 01/16/2022 CLINICAL DATA:  31 year old female with history of nausea and vomiting. EXAM: CT ABDOMEN AND PELVIS WITH CONTRAST TECHNIQUE: Multidetector CT imaging of the abdomen and pelvis was performed using the standard protocol following bolus administration of intravenous contrast. RADIATION DOSE REDUCTION: This exam was performed according to the departmental dose-optimization program which includes automated exposure control, adjustment of the mA and/or kV according to patient size and/or use of iterative reconstruction technique. CONTRAST:  80mL OMNIPAQUE IOHEXOL 300 MG/ML  SOLN  COMPARISON:  No priors. FINDINGS: Lower chest: Unremarkable. Hepatobiliary: No suspicious cystic or solid hepatic lesions. No intra or extrahepatic biliary ductal dilatation. Gallbladder is normal in appearance. Pancreas: No pancreatic mass. No pancreatic ductal dilatation. No pancreatic or peripancreatic fluid collections or inflammatory changes. Spleen: Unremarkable. Adrenals/Urinary Tract: Bilateral kidneys and adrenal glands are normal in appearance. No hydroureteronephrosis. Urinary bladder is unremarkable in appearance. Stomach/Bowel: Stomach is moderately distended. Duodenum also appears distended measuring up to 3.7 cm in diameter in the distal second and proximal third portion of the duodenum for transition beneath the superior mesenteric artery, beyond which the small bowel appears largely decompressed. No other pathologic dilatation of small bowel or colon. The appendix is not confidently identified and may be surgically absent. Regardless, there are no inflammatory changes noted adjacent to the cecum to suggest the presence of an acute appendicitis at this time. Vascular/Lymphatic: No significant atherosclerotic disease, aneurysm or dissection noted in the abdominal or pelvic vasculature. Narrowed aortomesenteric angle of 9.7 degrees (normal 28-65 degrees) and decreased aortomesenteric distance of only 5 mm (normal 10-34 mm). No lymphadenopathy noted in the abdomen or pelvis. Reproductive: Uterus and ovaries are unremarkable in appearance. Other: Markedly thin body habitus with paucity of intra mesenteric and retroperitoneal fat. No significant volume of ascites. No pneumoperitoneum. Musculoskeletal: There are no aggressive appearing lytic or blastic lesions noted in the visualized portions of the skeleton. IMPRESSION: 1. Extremely thin body habitus, with paucity of intra mesenteric and retroperitoneal fat. This is associated with marked narrowing of the third portion of the duodenum as it transitions  beneath the superior mesenteric artery, with narrowed aortomesenteric angle and distance, as detailed above. Clinical correlation for signs and symptoms of superior mesenteric artery syndrome are recommended. 2. No other potential acute findings are noted elsewhere in the abdomen or pelvis to account for the patient's symptoms. Electronically Signed   By: Trudie Reed M.D.   On: 01/16/2022 05:32   US Abdomen Limited RUQ (LIVER/GB)  Result Date: 01/16/2022 CLINICAL DATA:  Epigastric pain for 5 days EXAM: ULTRASOUND ABDOMEN LIMITED RIGHT UPPER QUADRANT COMPARISON:  None Available. FINDINGS: Gallbladder: No gallstones or wall thickening visualized. No sonographic Murphy sign noted by sonographer. Common bile duct: Diameter: 4.7 mm Liver: No focal lesion identified. Within normal limits in parenchymal echogenicity. Portal vein is patent on color Doppler imaging with normal direction of blood flow towards the liver. Other: None. IMPRESSION: Negative examination Electronically Signed   By: Jasmine Pang M.D.   On: 01/16/2022 03:43     Scheduled Meds:  pantoprazole  40 mg Oral BID AC   QUEtiapine  200 mg Oral QHS   Continuous Infusions:   LOS: 1 day   Marguerita Merles, DO [facility] Available via Epic secure chat 7am-7pm After  these hours, please refer to coverage provider listed on [email or web address] 01/17/2022, 5:57 PM

## 2022-01-17 NOTE — Transfer of Care (Signed)
Immediate Anesthesia Transfer of Care Note  Patient: Mariah Lozano  Procedure(s) Performed: ESOPHAGOGASTRODUODENOSCOPY (EGD) WITH PROPOFOL BIOPSY  Patient Location: PACU  Anesthesia Type:MAC  Level of Consciousness: awake and patient cooperative  Airway & Oxygen Therapy: Patient Spontanous Breathing and Patient connected to face mask oxygen  Post-op Assessment: Report given to RN and Post -op Vital signs reviewed and stable  Post vital signs: Reviewed and stable  Last Vitals:  Vitals Value Taken Time  BP    Temp    Pulse 69 01/17/22 0824  Resp 25 01/17/22 0824  SpO2 100 % 01/17/22 0824  Vitals shown include unvalidated device data.  Last Pain:  Vitals:   01/17/22 0740  TempSrc: Temporal  PainSc: 0-No pain      Patients Stated Pain Goal: 3 (70/01/74 9449)  Complications: No notable events documented.

## 2022-01-17 NOTE — Anesthesia Preprocedure Evaluation (Signed)
Anesthesia Evaluation  Patient identified by MRN, date of birth, ID band Patient awake    Reviewed: Allergy & Precautions, NPO status , Patient's Chart, lab work & pertinent test results  Airway Mallampati: II  TM Distance: >3 FB Neck ROM: Full    Dental  (+) Dental Advisory Given   Pulmonary Current Smoker,    breath sounds clear to auscultation       Cardiovascular negative cardio ROS   Rhythm:Regular Rate:Normal     Neuro/Psych negative neurological ROS     GI/Hepatic negative GI ROS, Neg liver ROS,   Endo/Other  negative endocrine ROS  Renal/GU negative Renal ROS     Musculoskeletal   Abdominal   Peds  Hematology negative hematology ROS (+)   Anesthesia Other Findings   Reproductive/Obstetrics                             Anesthesia Physical Anesthesia Plan  ASA: 2  Anesthesia Plan: MAC   Post-op Pain Management: Minimal or no pain anticipated   Induction:   PONV Risk Score and Plan: 1 and Propofol infusion and Treatment may vary due to age or medical condition  Airway Management Planned: Natural Airway and Nasal Cannula  Additional Equipment:   Intra-op Plan:   Post-operative Plan:   Informed Consent: I have reviewed the patients History and Physical, chart, labs and discussed the procedure including the risks, benefits and alternatives for the proposed anesthesia with the patient or authorized representative who has indicated his/her understanding and acceptance.       Plan Discussed with:   Anesthesia Plan Comments:         Anesthesia Quick Evaluation

## 2022-01-17 NOTE — Op Note (Signed)
[facility] Patient Name: Mariah AntiguaBrittany Lie Procedure Date: 01/17/2022 MRN: [id number] Attending MD: Kerin SalenArya Greogry Goodwyn , MD Date of Birth: 07/13/1991 CSN: [id number] Age: [age] Admit Type: Inpatient Procedure:                Upper GI endoscopy Indications:              Upper abdominal pain, Hematemesis, Abnormal CT of                            the GI tract, Nausea with vomiting, Weight loss Providers:                Kerin SalenArya Joelle [place], MD, Vicki MalletBrandy Grace, RN, Rozetta NunneryAnthony Gillies,                            Technician Referring MD:             [facility] Medicines:                Monitored Anesthesia Care Complications:            No immediate complications. Estimated blood loss:                            Minimal. Estimated Blood Loss:     Estimated blood loss was minimal. Procedure:                Pre-Anesthesia Assessment:                           - Prior to the procedure, a History and Physical                            was performed, and patient medications and                            allergies were reviewed. The patient's tolerance of                            previous anesthesia was also reviewed. The risks                            and benefits of the procedure and the sedation                            options and risks were discussed with the patient.                            All questions were answered, and informed consent                            was obtained. Prior Anticoagulants: The patient has                            taken no previous anticoagulant or antiplatelet  agents. ASA Grade Assessment: II - A patient with                            mild systemic disease. After reviewing the risks                            and benefits, the patient was deemed in                            satisfactory condition to undergo the procedure.                           After obtaining informed consent, the endoscope was                             passed under direct vision. Throughout the                            procedure, the patient's blood pressure, pulse, and                            oxygen saturations were monitored continuously. The                            [id number]-H190 ([id number]) Olympus endoscope was introduced                            through the mouth, and advanced to the second part                            of duodenum. The upper GI endoscopy was                            accomplished without difficulty. The patient                            tolerated the procedure well. Scope In: Scope Out: Findings:      The examined esophagus was normal.      The Z-line was regular and was found 35 cm from the incisors.      The entire examined stomach was normal. Biopsies were taken with a cold       forceps for Helicobacter pylori testing.      The cardia and gastric fundus were normal on retroflexion.      One non-bleeding cratered duodenal ulcer with a clean ulcer base       (Forrest Class III) was found in the duodenal bulb. The lesion was 12 mm       in largest dimension.      Biopsies for histology were taken with a cold forceps in the duodenal       bulb, in the first portion of the duodenum and in the second portion of       the duodenum for evaluation of celiac disease.      A 2 cm hiatal hernia was present. Impression:               -  Normal esophagus.                           - Z-line regular, 35 cm from the incisors.                           - Normal stomach. Biopsied.                           - Non-bleeding duodenal ulcer with a clean ulcer                            base (Forrest Class III).                           - 2 cm hiatal hernia.                           - Biopsies were taken with a cold forceps for                            evaluation of celiac disease. Moderate Sedation:      Patient did not receive moderate sedation for this procedure, but       instead received monitored anesthesia  care. Recommendation:           - Low fiber diet.                           - Use Protonix (pantoprazole) 40 mg PO BID for 2                            months.                           - Await pathology results. Procedure Code(s):        --- Professional ---                           [phone], Esophagogastroduodenoscopy, flexible,                            transoral; with biopsy, single or multiple Diagnosis Code(s):        --- Professional ---                           K26.9, Duodenal ulcer, unspecified as acute or                            chronic, without hemorrhage or perforation                           K44.9, Diaphragmatic hernia without obstruction or                            gangrene  R10.10, Upper abdominal pain, unspecified                           K92.0, Hematemesis                           R11.2, Nausea with vomiting, unspecified                           R63.4, Abnormal weight loss                           R93.3, Abnormal findings on diagnostic imaging of                            other parts of digestive tract CPT copyright 2019 [employer]. All rights reserved. The codes documented in this report are preliminary and upon coder review may  be revised to meet current compliance requirements. Kerin Salen, MD 01/17/2022 8:25:32 AM This report has been signed electronically. Number of Addenda: 0

## 2022-01-17 NOTE — Anesthesia Procedure Notes (Signed)
Procedure Name: MAC Date/Time: 01/17/2022 8:13 AM Performed by: Renato Shin, CRNA Pre-anesthesia Checklist: Patient identified, Emergency Drugs available, Suction available and Patient being monitored Patient Re-evaluated:Patient Re-evaluated prior to induction Oxygen Delivery Method: Simple face mask Preoxygenation: Pre-oxygenation with 100% oxygen Induction Type: IV induction Placement Confirmation: positive ETCO2 and breath sounds checked- equal and bilateral Dental Injury: Teeth and Oropharynx as per pre-operative assessment

## 2022-01-17 NOTE — Anesthesia Postprocedure Evaluation (Signed)
Anesthesia Post Note  Patient: Mariah Lozano  Procedure(s) Performed: ESOPHAGOGASTRODUODENOSCOPY (EGD) WITH PROPOFOL BIOPSY     Patient location during evaluation: PACU Anesthesia Type: MAC Level of consciousness: awake and alert Pain management: pain level controlled Vital Signs Assessment: post-procedure vital signs reviewed and stable Respiratory status: spontaneous breathing, nonlabored ventilation, respiratory function stable and patient connected to nasal cannula oxygen Cardiovascular status: stable and blood pressure returned to baseline Postop Assessment: no apparent nausea or vomiting Anesthetic complications: no   No notable events documented.  Last Vitals:  Vitals:   01/17/22 0850 01/17/22 0906  BP: (!) 95/59 100/66  Pulse: 67 64  Resp: 20 14  Temp:  36.4 C  SpO2: 98% 100%    Last Pain:  Vitals:   01/17/22 0906  TempSrc: Oral  PainSc:                  [name]

## 2022-01-17 NOTE — Interval H&P Note (Signed)
History and Physical Interval Note: 31/famele with nausea, vomiting, weight loss, 1 episode of hematemesis, upper abdominal pain and abnormal CT for EGD.  01/17/2022 8:03 AM  Nadara Mustard Lapier  has presented today for EGD, with the diagnosis of nausea, vomiting, weight loss, 1 episode  of vomiting blood, upper abdominal pain.  The various methods of treatment have been discussed with the patient and family. After consideration of risks, benefits and other options for treatment, the patient has consented to  Procedure(s): ESOPHAGOGASTRODUODENOSCOPY (EGD) WITH PROPOFOL (N/A) as a surgical intervention.  The patient's history has been reviewed, patient examined, no change in status, stable for surgery.  I have reviewed the patient's chart and labs.  Questions were answered to the patient's satisfaction.     Kerin Salen

## 2022-01-18 ENCOUNTER — Encounter (HOSPITAL_COMMUNITY): Payer: Self-pay | Admitting: Gastroenterology

## 2022-01-18 DIAGNOSIS — R112 Nausea with vomiting, unspecified: Secondary | ICD-10-CM | POA: Diagnosis not present

## 2022-01-18 DIAGNOSIS — K269 Duodenal ulcer, unspecified as acute or chronic, without hemorrhage or perforation: Secondary | ICD-10-CM | POA: Diagnosis not present

## 2022-01-18 DIAGNOSIS — R109 Unspecified abdominal pain: Secondary | ICD-10-CM | POA: Diagnosis not present

## 2022-01-18 LAB — COMPREHENSIVE METABOLIC PANEL
ALT: 13 U/L (ref 0–44)
AST: 11 U/L — ABNORMAL LOW (ref 15–41)
Albumin: 3.3 g/dL — ABNORMAL LOW (ref 3.5–5.0)
Alkaline Phosphatase: 36 U/L — ABNORMAL LOW (ref 38–126)
Anion gap: 4 — ABNORMAL LOW (ref 5–15)
BUN: 11 mg/dL (ref 6–20)
CO2: 22 mmol/L (ref 22–32)
Calcium: 8 mg/dL — ABNORMAL LOW (ref 8.9–10.3)
Chloride: 117 mmol/L — ABNORMAL HIGH (ref 98–111)
Creatinine, Ser: 0.65 mg/dL (ref 0.44–1.00)
GFR, Estimated: >60 mL/min (ref >60)
Glucose, Bld: 80 mg/dL (ref 70–99)
Potassium: 3.5 mmol/L (ref 3.5–5.1)
Sodium: 143 mmol/L (ref 135–145)
Total Bilirubin: 0.5 mg/dL (ref 0.3–1.2)
Total Protein: 5.4 g/dL — ABNORMAL LOW (ref 6.5–8.1)

## 2022-01-18 LAB — CBC WITH DIFFERENTIAL/PLATELET
Abs Immature Granulocytes: 0.03 10*3/uL (ref 0.00–0.07)
Basophils Absolute: 0.1 10*3/uL (ref 0.0–0.1)
Basophils Relative: 1 %
Eosinophils Absolute: 0.3 10*3/uL (ref 0.0–0.5)
Eosinophils Relative: 3 %
HCT: 38.3 % (ref 36.0–46.0)
Hemoglobin: 13.1 g/dL (ref 12.0–15.0)
Immature Granulocytes: 0 %
Lymphocytes Relative: 45 %
Lymphs Abs: 4.8 10*3/uL — ABNORMAL HIGH (ref 0.7–4.0)
MCH: 32.6 pg (ref 26.0–34.0)
MCHC: 34.2 g/dL (ref 30.0–36.0)
MCV: 95.3 fL (ref 80.0–100.0)
Monocytes Absolute: 0.8 10*3/uL (ref 0.1–1.0)
Monocytes Relative: 7 %
Neutro Abs: 4.8 10*3/uL (ref 1.7–7.7)
Neutrophils Relative %: 44 %
Platelets: 308 10*3/uL (ref 150–400)
RBC: 4.02 MIL/uL (ref 3.87–5.11)
RDW: 13.9 % (ref 11.5–15.5)
WBC: 10.9 10*3/uL — ABNORMAL HIGH (ref 4.0–10.5)
nRBC: 0 % (ref 0.0–0.2)

## 2022-01-18 LAB — PHOSPHORUS: Phosphorus: 4 mg/dL (ref 2.5–4.6)

## 2022-01-18 LAB — MAGNESIUM: Magnesium: 2.3 mg/dL (ref 1.7–2.4)

## 2022-01-18 MED ORDER — PANTOPRAZOLE SODIUM 40 MG PO TBEC: 40.0000 mg | DELAYED_RELEASE_TABLET | Freq: Two times a day (BID) | ORAL | 1 refills | Status: AC

## 2022-01-18 MED ORDER — SUCRALFATE 1 GM/10ML PO SUSP
1.0000 g | Freq: Three times a day (TID) | ORAL | 0 refills | Status: AC
Start: 2022-01-18 — End: ?

## 2022-01-18 MED ORDER — ACETAMINOPHEN 325 MG PO TABS: 650.0000 mg | ORAL_TABLET | Freq: Four times a day (QID) | ORAL | 0 refills | Status: AC | PRN

## 2022-01-18 MED ORDER — SUCRALFATE 1 GM/10ML PO SUSP: 1.0000 g | Freq: Three times a day (TID) | ORAL | Status: DC

## 2022-01-18 NOTE — TOC CM/SW Note (Signed)
  Transition of Care [facility]) Screening Note   Patient Details  Name: Mariah Lozano Date of Birth: 1991-02-10   Transition of Care [facility]) CM/SW Contact:    Ross Ludwig, LCSW Phone Number: 01/18/2022, 10:51 AM    Transition of Care Department [facility]) has reviewed patient and no TOC needs have been identified at this time. We will continue to monitor patient advancement through interdisciplinary progression rounds. If new patient transition needs arise, please place a TOC consult.

## 2022-01-18 NOTE — Progress Notes (Signed)
[facility] Progress Note  Mariah Lozano 31 y.o. [birth year]  CC:  Abnormal CT scan, concern for SMA syndrome   Subjective: Patient doing well today, sitting in bedside chair.  States she would like to be discharged today.  Denies nausea, vomiting, fever, chills, heartburn, abdominal pain.  ROS : Review of Systems  Constitutional:  Negative for chills and fever.  Gastrointestinal:  Negative for abdominal pain, blood in stool, constipation, diarrhea, heartburn, melena, nausea and vomiting.  Genitourinary:  Negative for dysuria and urgency.  Musculoskeletal:  Negative for falls.     Objective: Vital signs in last 24 hours: Vitals:   01/17/22 2017 01/18/22 0516  BP: 112/71 (!) 86/51  Pulse: 85 68  Resp: 20 20  Temp: 98.2 F (36.8 C) 98.2 F (36.8 C)  SpO2: 100% 100%    Physical Exam:  General:  Alert, cooperative, no distress, appears stated age  Head:  Normocephalic, without obvious abnormality, atraumatic  Eyes:  Anicteric sclera, EOM's intact  Lungs:   Clear to auscultation bilaterally, respirations unlabored  Heart:  Regular rate and rhythm, S1, S2 normal  Abdomen:   Soft, non-tender, bowel sounds active all four quadrants,  no masses,   Extremities: Extremities normal, atraumatic, no  edema  Pulses: 2+ and symmetric    Lab Results: Recent Labs    01/17/22 0659 01/18/22 0353  NA 139 143  K 4.4 3.5  CL 112* 117*  CO2 23 22  GLUCOSE 84 80  BUN 6 11  CREATININE 0.65 0.65  CALCIUM 8.3* 8.0*  MG 2.3 2.3  PHOS  --  4.0   Recent Labs    01/17/22 0659 01/18/22 0353  AST 14* 11*  ALT 15 13  ALKPHOS 38 36*  BILITOT 0.8 0.5  PROT 5.7* 5.4*  ALBUMIN 3.4* 3.3*   Recent Labs    01/17/22 0659 01/18/22 0353  WBC 8.7 10.9*  NEUTROABS  --  4.8  HGB 13.1 13.1  HCT 38.8 38.3  MCV 96.3 95.3  PLT 323 308   No results for input(s): LABPROT, INR in the last 72 hours.    Assessment Nausea, vomiting, 15 pound weight loss in 4 weeks   CT  abdomen: Marked narrowing of third portion of duodenum as it transitions beneath the superior mesenteric artery with narrowed aortomesenteric angle and distance, associated with moderately distended stomach, distended duodenum up to 3.7 cm in distal second and proximal third portion Extremely thin body habitus with paucity of intra mesenteric and retroperitoneal fat   Normal electrolytes, normal renal function, normal T. bili and albumin, normal LFTs Minimal leukocytosis, WBC 10.9 Negative hCG Ultrasound: Normal liver, normal gallbladder, normal CBD  EGD 01/17/2022 - Normal esophagus, normal stomach, biopsied.  Nonbleeding duodenal ulcer with a clean ulcer base.  2 cm hiatal hernia.  Plan: Continue PPI BID for 2 months. Add Carafate.  Continue supportive care as needed during admission. Discussed with Dr. Ewing Schlein. Okay for discharge from GI standpoint. Will need outpatient follow up.   Berdine Dance PA-C 01/18/2022, 11:11 AM  Contact #  [phone]

## 2022-01-18 NOTE — Progress Notes (Cosign Needed Addendum)
[facility] Progress Note  1 Day Post-Op  Subjective: CC:  Dressed in street clothes, upset and wants to go home. States she was having a lot of pain at home but her pain is better. States she tolerated 3 sub sandwiches yesterday without N/V. States she needs to leave because she has bills to pay and pets to care for.    Objective: Vital signs in last 24 hours: Temp:  [97.8 F (36.6 C)-98.2 F (36.8 C)] 98.2 F (36.8 C) (06/05 0516) Pulse Rate:  [68-85] 68 (06/05 0516) Resp:  [16-20] 20 (06/05 0516) BP: (86-113)/(51-75) 86/51 (06/05 0516) SpO2:  [100 %] 100 % (06/05 0516) Weight:  [49.7 kg] 49.7 kg (06/05 0500) Last BM Date : 01/15/22  Intake/Output from previous day: 06/04 0701 - 06/05 0700 In: 1282.3 [P.O.:240; I.V.:1042.3] Out: -  Intake/Output this shift: No intake/output data recorded.  PE: Gen:  Alert, tearful, sitting up on window seat Card:  Regular rate and rhythm Pulm:  Normal effort ORA Abd: Soft, non-tender, non-distended, +BS Skin: warm and dry, no rashes  Psych: A&Ox3   Lab Results:  Recent Labs    01/17/22 0659 01/18/22 0353  WBC 8.7 10.9*  HGB 13.1 13.1  HCT 38.8 38.3  PLT 323 308   BMET Recent Labs    01/17/22 0659 01/18/22 0353  NA 139 143  K 4.4 3.5  CL 112* 117*  CO2 23 22  GLUCOSE 84 80  BUN 6 11  CREATININE 0.65 0.65  CALCIUM 8.3* 8.0*   PT/INR No results for input(s): LABPROT, INR in the last 72 hours. CMP     Component Value Date/Time   NA 143 01/18/2022 0353   K 3.5 01/18/2022 0353   CL 117 (H) 01/18/2022 0353   CO2 22 01/18/2022 0353   GLUCOSE 80 01/18/2022 0353   BUN 11 01/18/2022 0353   CREATININE 0.65 01/18/2022 0353   CALCIUM 8.0 (L) 01/18/2022 0353   PROT 5.4 (L) 01/18/2022 0353   ALBUMIN 3.3 (L) 01/18/2022 0353   AST 11 (L) 01/18/2022 0353   ALT 13 01/18/2022 0353   ALKPHOS 36 (L) 01/18/2022 0353   BILITOT 0.5 01/18/2022 0353   GFRNONAA >60 01/18/2022 0353   Lipase     Component Value  Date/Time   LIPASE 24 01/16/2022 0042       Studies/Results: No results found.  Anti-infectives: Anti-infectives (From admission, onward)    None        Assessment/Plan Duodenal ulcers R/O SMA syndrome This is a 31 year old female presenting with epigastric pain, nausea, and vomiting. EGD yesterday revealed a non-bleeding duodenal ulcer with a clean ulcer base. Dr. Therisa Doyne took biopsies for evaluation of celiac disease. Agree with PPI x 2 months. Add Carafate.I do think that the patients sxs are likely a result of duodenal ulcer. If symptoms recur or worsen despite treatment with PPI and Carafate then would recommend UGI and/or repeat upper endoscopy, I do not think we need to perform UGI today. Patient is now tolerating a diet without nausea or vomiting and her pain is improving. No urgent surgical needs a this time. SMA syndrome is rare and is a diagnosis of exclusion. Patient should follow up with GI for ulcer disease and can see [place] as needed if the concern for SMA syndrome persists after PPI/carafate therapy.       LOS: 2 days   I reviewed nursing notes, Consultant GI notes, hospitalist notes, last 24 h vitals and pain scores, last 48 h intake  and output, last 24 h labs and trends, and last 24 h imaging results.  Obie Dredge, PA-C [facility] Please see Amion for pager number during day hours 7:00am-4:30pm

## 2022-01-18 NOTE — Discharge Summary (Signed)
Physician Discharge Summary   Patient: Mariah Lozano MRN: [id number] DOB: 1991-01-02  Admit date:     01/16/2022  Discharge date: 01/18/22  Discharge Physician: Raiford Noble, DO   PCP: [facility]   Recommendations at discharge:   Follow up with PCP within 1-2 weeks and repeat CBC, CMP, Mag, Phos within 1 week Follow up with Gastroenterology Dr. Therisa Doyne within 1 week Follow up with General Surgery Dr. 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 [facility] Course: The patient is a 31 year old female with no real past medical history who comes to the emergency department with chief complaint of abdominal pain for last 5 days that worsened with food intake and associated daily nausea and emesis.  She had no other gastrointestinal symptoms and in the ED she is found to be afebrile with a pulse of 80 and respirations 18 and saturating 100%.  She received Maalox, ondansetron orally and IV, PPI as well as a normal saline bolus.  Further work-up showed that she had a cloudy urinalysis with a white count of 11.2.  Right upper quadrant ultrasound was done a CT of the abdomen pelvis showed an extremely thin body habitus with positive intra mesenteric and retroperitoneal fat there is associated marked narrowing of the third portion of the duodenum as it transitions between the superior mesenteric artery with no other acute findings.  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  IV fluids were provided and GI was consulted and they recommended NGT to suction but was never placed.  She is provided with analgesics and underwent an EGD today which showed that the gastric and cardia fundus were normal on retroflexion but she did have a nonbleeding cratered duodenal ulcer with a  clean ulcer base in the duodenal bulb.  She had biopsies taken with cold forceps biopsies were taken for H. pylori as well as further evaluation of celiac disease.  General surgery was consulted and recommended EGD and felt that the diagnosis of true SMA syndrome is a diagnosis of exclusion.  After her EGD GI started her on a on low fiber diet and pantoprazole 40 mg twice a day, to be continued for 2 months.  They felt no need for NG tube placement now and her fluids were discontinued  Assessment and Plan:  Epigastric pain with nausea, vomiting with concern for SMA syndrome ruled out; nausea or vomiting and pain in the setting of duodenal ulcer -CT scan was done and showed a mild dilation of the third portion of the duodenum as well as gastric distention -Patient has lost weight last few weeks -Nurses kept n.p.o. for EGD and GI recommended NGT suction but she refused this -GI and general surgery were consulted -General surgery felt the true SMA syndrome is a diagnosis of exclusion and recommended further work-up with upper GI after gastric decompression -EGD was done and did show a cratered duodenal nonbleeding ulcer  -GI recommended starting the patient on a low fiber diet and using PPI twice daily as well as waiting for pathology results given that biopsies were taken for evaluation for celiac as well as H. pylori -Appreciate both general surgery as well as gastroenterology evaluations and will continue to monitor and trend electrolytes and continue with antiemetics and analgesics as necessary - IV fluids and  now been discontinued given that the patient is tolerating oral intake well -WBC went from 11.2 and likely reactive and is now 8.7 yesterday and today is 10.9 and likely reactive -He is getting pain control with acetaminophen and hydromorphone -She is tolerating her food well and has no issues and is stable for discharge.  GI and general surgery recommending outpatient follow-up in GI  recommends continuing PPI twice daily for 8 weeks and then also adding Carafate before meals and at bedtime -Patient to follow-up with her PCP and gastroenterologist in outpatient setting and general surgery as needed   Panic attacks and generalized anxiety disorder -Received IV lorazepam -Continue to monitor and may need some hydroxyzine -Continue with her home Seroquel 200 mg p.o. nightly -Follow-up in outpatient setting   History of drug use including ecstasy -Consider obtaining UDS but this can be done in outpatient setting -Denies alcohol abuse   Leukocytosis -Mild and likely reactive is improved.  Patient is WBC went from 11.2 is now 8.7 yesterday but now slightly bumped to 10.9 and is now likely reactive -Repeat CBC within 1 week and monitor for signs and symptoms of infection and she has none   Hypoalbuminemia -Patient's albumin level went from 4.4 is now 3.4 and is now trended down to 3.3 -Continue to monitor and trend and repeat CMP in the a.m.   Underweight -Consult nutritionist for further evaluation can be done in outpatient setting   Consultants: Gastroenterology; General Surgery  Procedures performed:  EGD  Findings:      The examined esophagus was normal.      The Z-line was regular and was found 35 cm from the incisors.      The entire examined stomach was normal. Biopsies were taken with a cold       forceps for Helicobacter pylori testing.      The cardia and gastric fundus were normal on retroflexion.      One non-bleeding cratered duodenal ulcer with a clean ulcer base       (Forrest Class III) was found in the duodenal bulb. The lesion was 12 mm       in largest dimension.      Biopsies for histology were taken with a cold forceps in the duodenal       bulb, in the first portion of the duodenum and in the second portion of       the duodenum for evaluation of celiac disease.      A 2 cm hiatal hernia was present. Impression:               - Normal  esophagus.                           - Z-line regular, 35 cm from the incisors.                           - Normal stomach. Biopsied.                           - Non-bleeding duodenal ulcer with a clean ulcer                            base (Forrest Class III).                           -  2 cm hiatal hernia.                           - Biopsies were taken with a cold forceps for                            evaluation of celiac disease.  Disposition: Home Diet recommendation:  Discharge Diet Orders (From admission, onward)     Start     Ordered   01/18/22 0000  Diet - low sodium heart healthy        01/18/22 1019           Dysphagia type 3 Thin Liquid  DISCHARGE MEDICATION: Allergies as of 01/18/2022   No Known Allergies      Medication List     TAKE these medications    acetaminophen 325 MG tablet Commonly known as: TYLENOL Take 2 tablets (650 mg total) by mouth every 6 (six) hours as needed for mild pain (or Fever >/= 101).   pantoprazole 40 MG tablet Commonly known as: PROTONIX Take 1 tablet (40 mg total) by mouth 2 (two) times daily before a meal.   QUEtiapine 200 MG tablet Commonly known as: SEROQUEL Take 200 mg by mouth at bedtime.   sucralfate 1 GM/10ML suspension Commonly known as: CARAFATE Take 10 mLs (1 g total) by mouth 4 (four) times daily -  with meals and at bedtime.        Follow-up Information     [facility]. Call.   Specialty: Family Medicine Why: Follow up within 1 week        Ronnette Juniper, MD. Call.   Specialty: Gastroenterology Why: Follow up within 1 week Contact information: [place] [phone]         Dwan Bolt, MD Follow up.   Specialty: General Surgery Why: Follow up As needed Contact information: [place]   [phone]                Discharge Exam: Filed Weights   01/16/22 0040 01/18/22  0500  Weight: 49 kg 49.7 kg   Today's Vitals   01/17/22 2051 01/18/22 0500 01/18/22 0516 01/18/22 0800  BP:   (!) 86/51   Pulse:   68   Resp:   20   Temp:   98.2 F (36.8 C)   TempSrc:   Oral   SpO2:   100%   Weight:  49.7 kg    Height:      PainSc: 0-No pain   0-No pain   Body mass index is 17.15 kg/m.  Examination: Physical Exam:  Constitutional: Thin female in no acute distress appears calm Respiratory: Slightly diminished to auscultation bilaterally, no wheezing, rales, rhonchi or crackles. Normal respiratory effort and patient is not tachypenic. No accessory muscle use.  Unlabored breathing Cardiovascular: RRR, no murmurs / rubs / gallops. S1 and S2 auscultated.  Abdomen: Soft, non-tender, non-distended. Bowel sounds positive.  GU: Deferred. Musculoskeletal: No clubbing / cyanosis of digits/nails. No joint deformity upper and lower extremities. Neurologic: CN 2-12 grossly intact with no focal deficits. Romberg sign and cerebellar reflexes not assessed.  Psychiatric: Normal judgment and insight. Alert and oriented x 3. Normal mood and appropriate affect.   Condition at discharge: stable  The results of significant diagnostics from this hospitalization (including imaging, microbiology, ancillary and  laboratory) are listed below for reference.   Imaging Studies: CT ABDOMEN PELVIS W CONTRAST  Result Date: 01/16/2022 CLINICAL DATA:  31 year old female with history of nausea and vomiting. EXAM: CT ABDOMEN AND PELVIS WITH CONTRAST TECHNIQUE: Multidetector CT imaging of the abdomen and pelvis was performed using the standard protocol following bolus administration of intravenous contrast. RADIATION DOSE REDUCTION: This exam was performed according to the departmental dose-optimization program which includes automated exposure control, adjustment of the mA and/or kV according to patient size and/or use of iterative reconstruction technique. CONTRAST:  71mL OMNIPAQUE IOHEXOL 300 MG/ML   SOLN COMPARISON:  No priors. FINDINGS: Lower chest: Unremarkable. Hepatobiliary: No suspicious cystic or solid hepatic lesions. No intra or extrahepatic biliary ductal dilatation. Gallbladder is normal in appearance. Pancreas: No pancreatic mass. No pancreatic ductal dilatation. No pancreatic or peripancreatic fluid collections or inflammatory changes. Spleen: Unremarkable. Adrenals/Urinary Tract: Bilateral kidneys and adrenal glands are normal in appearance. No hydroureteronephrosis. Urinary bladder is unremarkable in appearance. Stomach/Bowel: Stomach is moderately distended. Duodenum also appears distended measuring up to 3.7 cm in diameter in the distal second and proximal third portion of the duodenum for transition beneath the superior mesenteric artery, beyond which the small bowel appears largely decompressed. No other pathologic dilatation of small bowel or colon. The appendix is not confidently identified and may be surgically absent. Regardless, there are no inflammatory changes noted adjacent to the cecum to suggest the presence of an acute appendicitis at this time. Vascular/Lymphatic: No significant atherosclerotic disease, aneurysm or dissection noted in the abdominal or pelvic vasculature. Narrowed aortomesenteric angle of 9.7 degrees (normal 28-65 degrees) and decreased aortomesenteric distance of only 5 mm (normal 10-34 mm). No lymphadenopathy noted in the abdomen or pelvis. Reproductive: Uterus and ovaries are unremarkable in appearance. Other: Markedly thin body habitus with paucity of intra mesenteric and retroperitoneal fat. No significant volume of ascites. No pneumoperitoneum. Musculoskeletal: There are no aggressive appearing lytic or blastic lesions noted in the visualized portions of the skeleton. IMPRESSION: 1. Extremely thin body habitus, with paucity of intra mesenteric and retroperitoneal fat. This is associated with marked narrowing of the third portion of the duodenum as it  transitions beneath the superior mesenteric artery, with narrowed aortomesenteric angle and distance, as detailed above. Clinical correlation for signs and symptoms of superior mesenteric artery syndrome are recommended. 2. No other potential acute findings are noted elsewhere in the abdomen or pelvis to account for the patient's symptoms. Electronically Signed   By: Vinnie Langton M.D.   On: 01/16/2022 05:32   US Abdomen Limited RUQ (LIVER/GB)  Result Date: 01/16/2022 CLINICAL DATA:  Epigastric pain for 5 days EXAM: ULTRASOUND ABDOMEN LIMITED RIGHT UPPER QUADRANT COMPARISON:  None Available. FINDINGS: Gallbladder: No gallstones or wall thickening visualized. No sonographic Murphy sign noted by sonographer. Common bile duct: Diameter: 4.7 mm Liver: No focal lesion identified. Within normal limits in parenchymal echogenicity. Portal vein is patent on color Doppler imaging with normal direction of blood flow towards the liver. Other: None. IMPRESSION: Negative examination Electronically Signed   By: Donavan Foil M.D.   On: 01/16/2022 03:43    Microbiology: No results found for this or any previous visit.  Labs: CBC: Recent Labs  Lab 01/16/22 0042 01/17/22 0659 01/18/22 0353  WBC 11.2* 8.7 10.9*  NEUTROABS  --   --  4.8  HGB 14.0 13.1 13.1  HCT 41.7 38.8 38.3  MCV 95.2 96.3 95.3  PLT 364 323 [id number]   Basic Metabolic Panel: Recent Labs  Lab  01/16/22 0042 01/17/22 0659 01/18/22 0353  NA 141 139 143  K 3.8 4.4 3.5  CL 109 112* 117*  CO2 27 23 22   GLUCOSE 101* 84 80  BUN 11 6 11   CREATININE 0.66 0.65 0.65  CALCIUM 8.9 8.3* 8.0*  MG  --  2.3 2.3  PHOS  --   --  4.0   Liver Function Tests: Recent Labs  Lab 01/16/22 0042 01/17/22 0659 01/18/22 0353  AST 16 14* 11*  ALT 19 15 13   ALKPHOS 43 38 36*  BILITOT 0.5 0.8 0.5  PROT 7.0 5.7* 5.4*  ALBUMIN 4.4 3.4* 3.3*   CBG: No results for input(s): GLUCAP in the last 168 hours.  Discharge time spent: greater than 30  minutes.  Signed: Raiford Noble, DO [facility] 01/18/2022

## 2022-01-18 NOTE — Plan of Care (Signed)
°  Problem: Clinical Measurements: °Goal: Respiratory complications will improve °Outcome: Adequate for Discharge °  °Problem: Clinical Measurements: °Goal: Cardiovascular complication will be avoided °Outcome: Adequate for Discharge °  °Problem: Nutrition: °Goal: Adequate nutrition will be maintained °Outcome: Adequate for Discharge °  °Problem: Elimination: °Goal: Will not experience complications related to bowel motility °Outcome: Adequate for Discharge °  °Problem: Pain Managment: °Goal: General experience of comfort will improve °Outcome: Adequate for Discharge °  °

## 2022-01-18 NOTE — TOC Progression Note (Signed)
Transition of Care [facility]) - Progression Note    Patient Details  Name: Mariah Lozano MRN: [id number] Date of Birth: 1990/11/19  Transition of [facility]) CM/SW Contact  Geni Bers, RN Phone Number: 01/18/2022, 12:22 PM  Clinical Narrative:     Pt was called to inform her that she left her [place]rivers License in room. Pt states she will return to get them.         Expected Discharge Plan and Services           Expected Discharge Date: 01/18/22                                     Social Determinants of Health (SDOH) Interventions    Readmission Risk Interventions     View : No data to display.

## 2022-01-20 LAB — SURGICAL PATHOLOGY

## 2023-02-17 IMAGING — CT CT ABD-PELV W/ CM
2 of 4 series · 15 of 46 positions shown, 17 images · IV contrast (agent unspecified)
Comparison: No priors.

CLINICAL DATA: 31-year-old female with history of nausea and
vomiting.

EXAM:
CT ABDOMEN AND PELVIS WITH CONTRAST
TECHNIQUE: Multidetector CT imaging of the abdomen and pelvis was performed
using the standard protocol following bolus administration of
intravenous contrast.

[Series 2: axial st · axial · 0.71mm/px · z∈[+1022,+1417]mm · 12 of 91 slices shown, 14 images]
[im 6/91  soft-tissue]
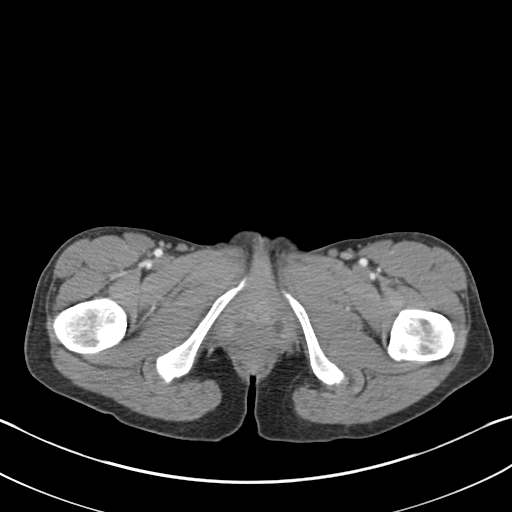
[im 6/91  bone]
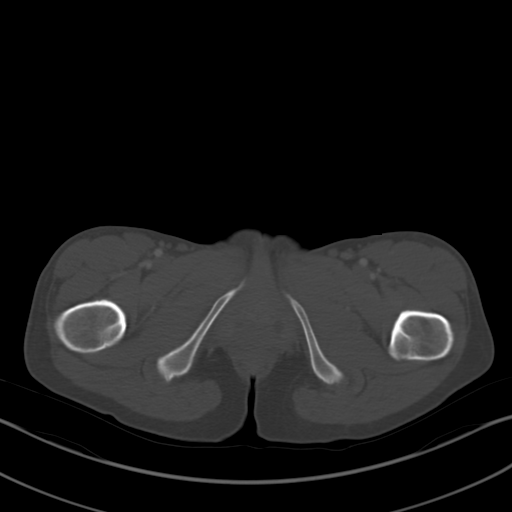
[im 12/91  soft-tissue]
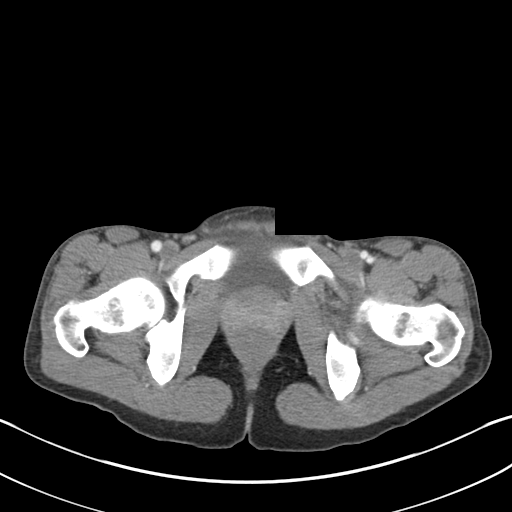
[im 23/91  soft-tissue]
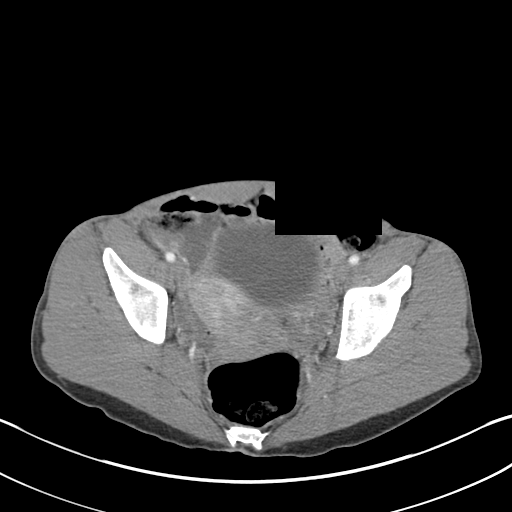
[im 29/91  soft-tissue]
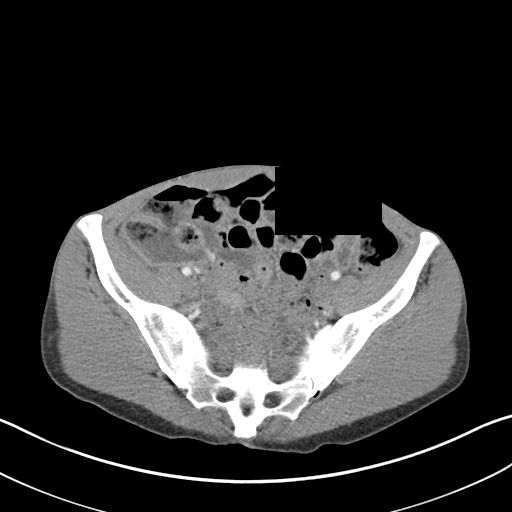
[im 34/91  soft-tissue]
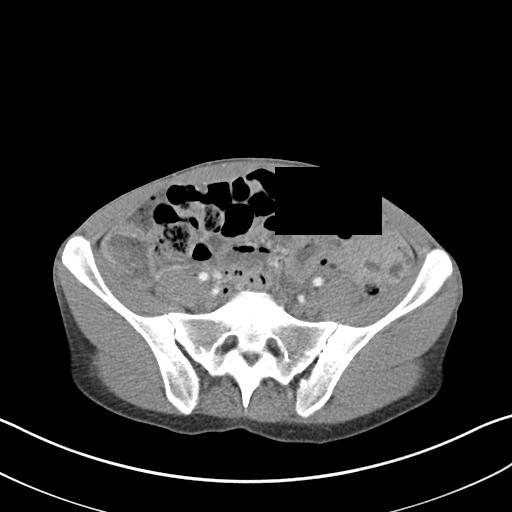
[im 40/91  soft-tissue]
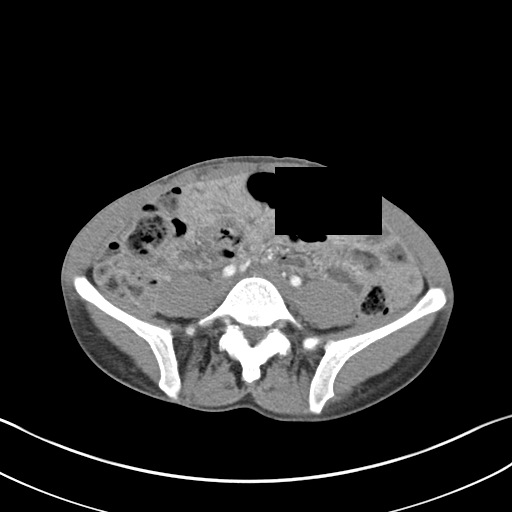
[im 51/91  soft-tissue]
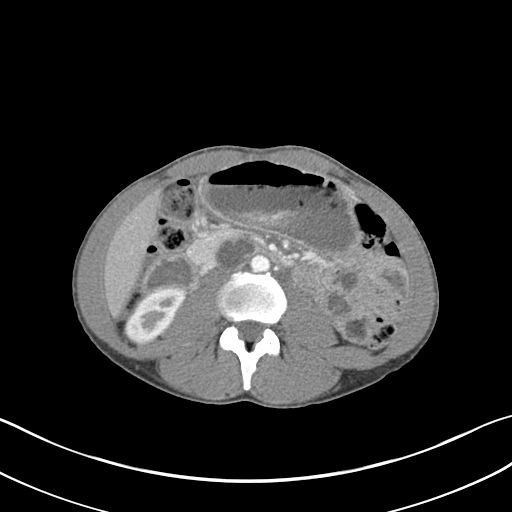
[im 57/91  soft-tissue]
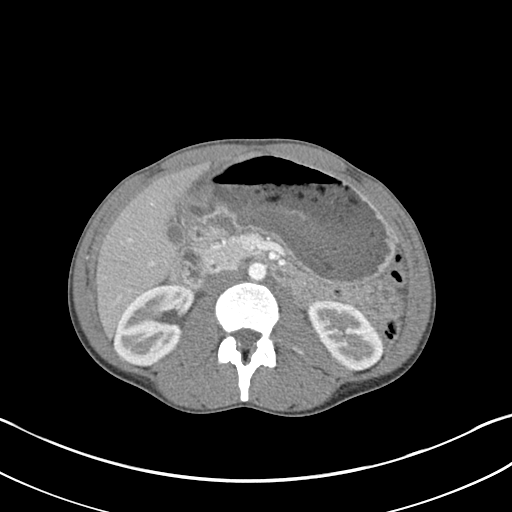
[im 62/91  soft-tissue]
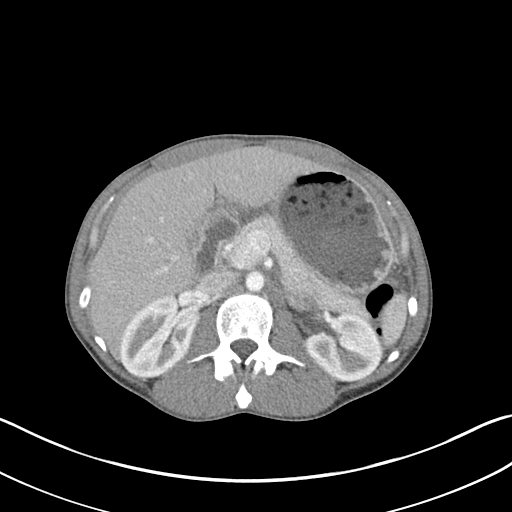
[im 62/91  bone]
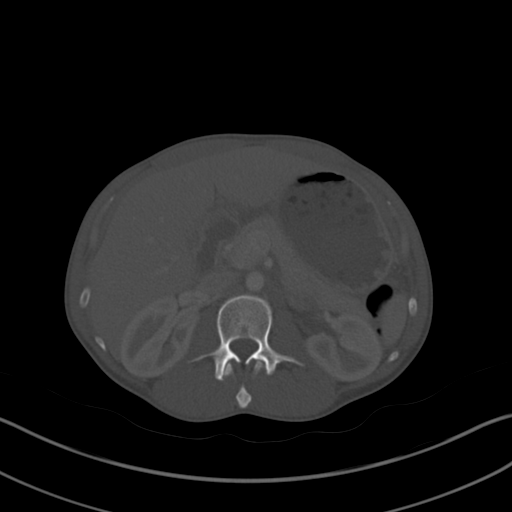
[im 68/91  soft-tissue]
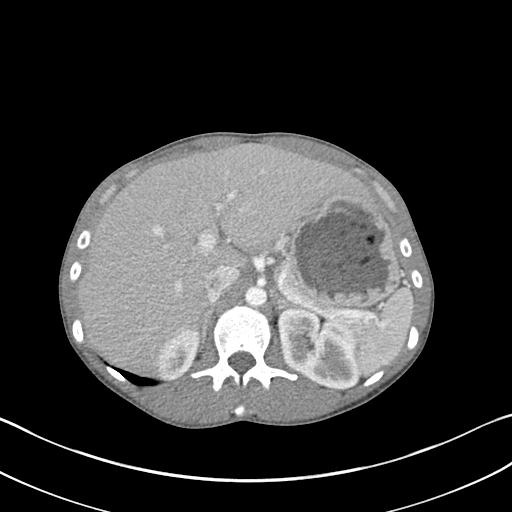
[im 79/91  soft-tissue]
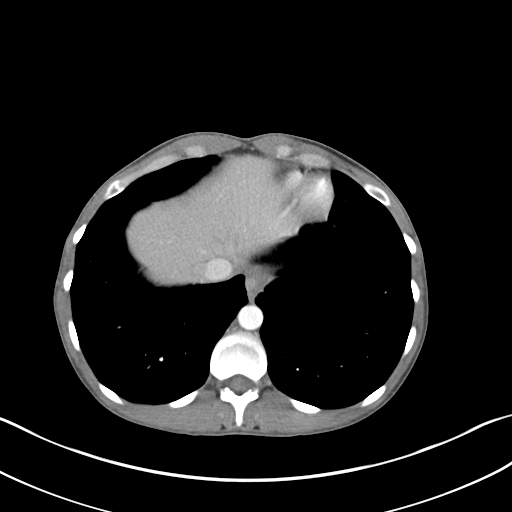
[im 85/91  soft-tissue]
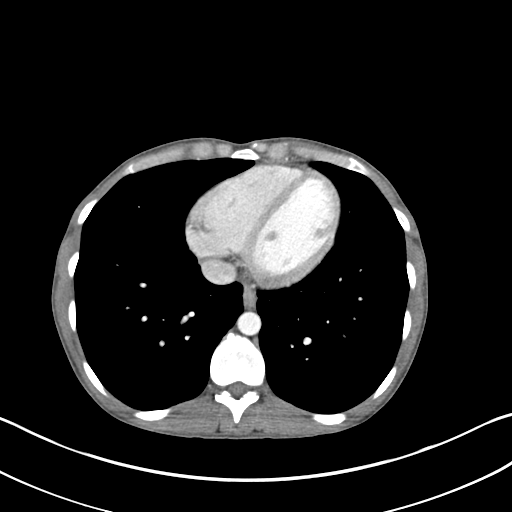

[Series 5: coronal st · coronal · 0.64mm/px · 3 of 118 slices shown]
[im 40/118  soft-tissue]
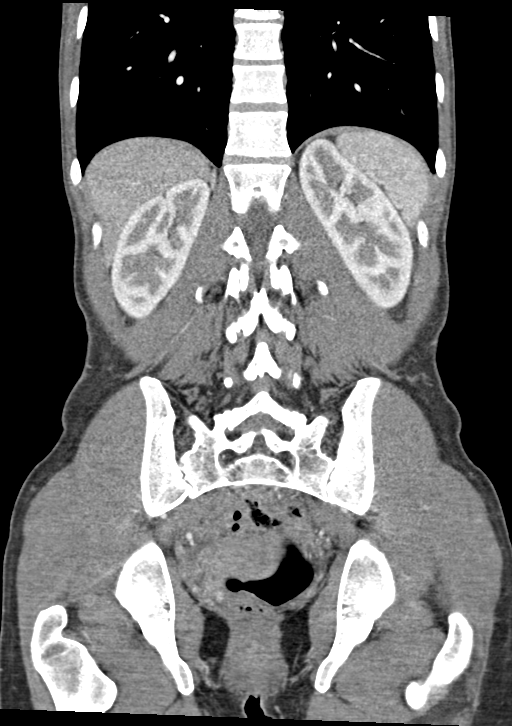
[im 53/118  soft-tissue]
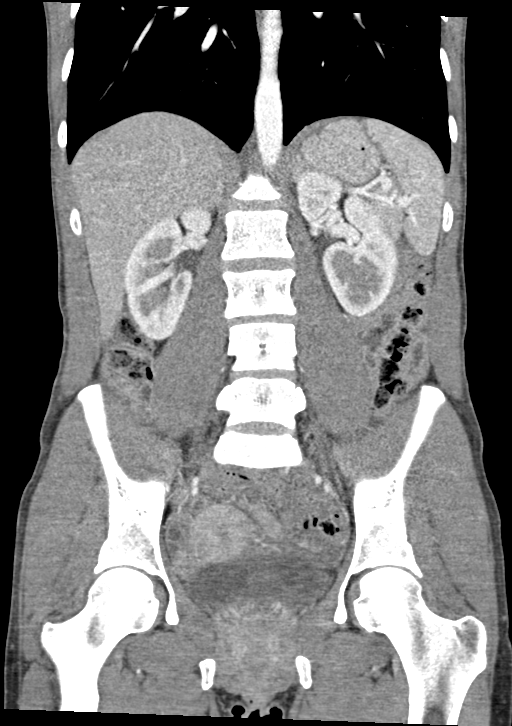
[im 66/118  soft-tissue]
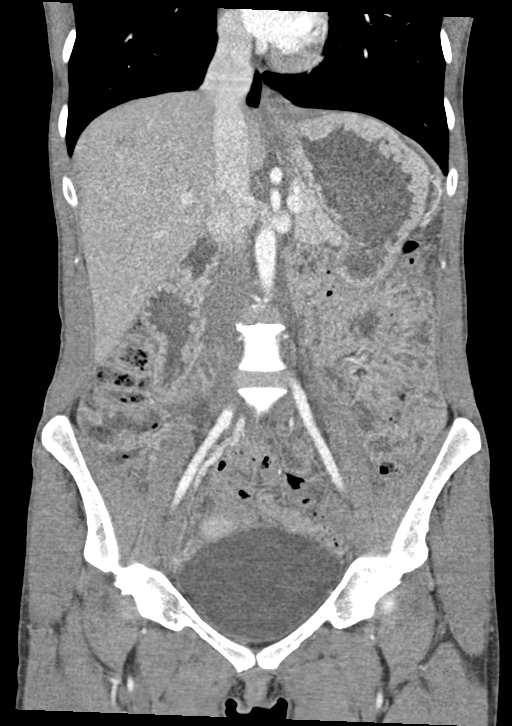

[15 of 46 positions shown; findings below may reference images not displayed]

RADIATION DOSE REDUCTION: This exam was performed according to the
departmental dose-optimization program which includes automated
exposure control, adjustment of the mA and/or kV according to
patient size and/or use of iterative reconstruction technique.

CONTRAST:  80mL OMNIPAQUE IOHEXOL 300 MG/ML  SOLN
FINDINGS: Lower chest: Unremarkable.

Hepatobiliary: No suspicious cystic or solid hepatic lesions. No
intra or extrahepatic biliary ductal dilatation. Gallbladder is
normal in appearance.

Pancreas: No pancreatic mass. No pancreatic ductal dilatation. No
pancreatic or peripancreatic fluid collections or inflammatory
changes.

Spleen: Unremarkable.

Adrenals/Urinary Tract: Bilateral kidneys and adrenal glands are
normal in appearance. No hydroureteronephrosis. Urinary bladder is
unremarkable in appearance.

Stomach/Bowel: Stomach is moderately distended. Duodenum also
appears distended measuring up to 3.7 cm in diameter in the distal
second and proximal third portion of the duodenum for transition
beneath the superior mesenteric artery, beyond which the small bowel
appears largely decompressed. No other pathologic dilatation of
small bowel or colon. The appendix is not confidently identified and
may be surgically absent. Regardless, there are no inflammatory
changes noted adjacent to the cecum to suggest the presence of an
acute appendicitis at this time.

Vascular/Lymphatic: No significant atherosclerotic disease, aneurysm
or dissection noted in the abdominal or pelvic vasculature. Narrowed
aortomesenteric angle of 9.7 degrees (normal 28-65 degrees) and
decreased aortomesenteric distance of only 5 mm (normal 10-34 mm).
No lymphadenopathy noted in the abdomen or pelvis.

Reproductive: Uterus and ovaries are unremarkable in appearance.

Other: Markedly thin body habitus with paucity of intra mesenteric
and retroperitoneal fat. No significant volume of ascites. No
pneumoperitoneum.

Musculoskeletal: There are no aggressive appearing lytic or blastic
lesions noted in the visualized portions of the skeleton.
IMPRESSION: 1. Extremely thin body habitus, with paucity of intra mesenteric and
retroperitoneal fat. This is associated with marked narrowing of the
third portion of the duodenum as it transitions beneath the superior
mesenteric artery, with narrowed aortomesenteric angle and distance,
as detailed above. Clinical correlation for signs and symptoms of
superior mesenteric artery syndrome are recommended.
2. No other potential acute findings are noted elsewhere in the
abdomen or pelvis to account for the patient's symptoms.
